# Patient Record
Sex: Female | Born: 1965 | Race: Black or African American | Hispanic: No | Smoking: Never smoker
Health system: Southern US, Community
[De-identification: ages and names within clinical notes are randomized; demographics above are authoritative.]

## PROBLEM LIST (undated history)

## (undated) DIAGNOSIS — K824 Cholesterolosis of gallbladder: Secondary | ICD-10-CM

## (undated) DIAGNOSIS — I10 Essential (primary) hypertension: Secondary | ICD-10-CM

## (undated) DIAGNOSIS — I639 Cerebral infarction, unspecified: Secondary | ICD-10-CM

## (undated) DIAGNOSIS — Z8673 Personal history of transient ischemic attack (TIA), and cerebral infarction without residual deficits: Secondary | ICD-10-CM

## (undated) DIAGNOSIS — D509 Iron deficiency anemia, unspecified: Secondary | ICD-10-CM

## (undated) DIAGNOSIS — D649 Anemia, unspecified: Secondary | ICD-10-CM

## (undated) DIAGNOSIS — E785 Hyperlipidemia, unspecified: Secondary | ICD-10-CM

## (undated) HISTORY — PX: TUBAL LIGATION: SHX77

---

## 2010-11-25 ENCOUNTER — Emergency Department (HOSPITAL_COMMUNITY)
Admission: EM | Admit: 2010-11-25 | Discharge: 2010-11-25 | Disposition: A | Discharge status: Home / Self Care | Payer: Managed Care, Other (non HMO) | Attending: Emergency Medicine | Admitting: Emergency Medicine

## 2010-11-25 DIAGNOSIS — Z79899 Other long term (current) drug therapy: Secondary | ICD-10-CM | POA: Insufficient documentation

## 2010-11-25 DIAGNOSIS — Z7982 Long term (current) use of aspirin: Secondary | ICD-10-CM | POA: Insufficient documentation

## 2010-11-25 DIAGNOSIS — H9209 Otalgia, unspecified ear: Secondary | ICD-10-CM | POA: Insufficient documentation

## 2010-11-25 DIAGNOSIS — I10 Essential (primary) hypertension: Secondary | ICD-10-CM | POA: Insufficient documentation

## 2010-11-25 DIAGNOSIS — Z8673 Personal history of transient ischemic attack (TIA), and cerebral infarction without residual deficits: Secondary | ICD-10-CM | POA: Insufficient documentation

## 2010-11-25 DIAGNOSIS — H9319 Tinnitus, unspecified ear: Secondary | ICD-10-CM | POA: Insufficient documentation

## 2011-01-29 ENCOUNTER — Ambulatory Visit
Admission: RE | Admit: 2011-01-29 | Discharge: 2011-01-29 | Disposition: A | Discharge status: Home / Self Care | Payer: Managed Care, Other (non HMO) | Source: Ambulatory Visit | Attending: Family Medicine | Admitting: Family Medicine

## 2011-01-29 ENCOUNTER — Other Ambulatory Visit: Payer: Self-pay | Admitting: Family Medicine

## 2011-01-29 DIAGNOSIS — I6789 Other cerebrovascular disease: Secondary | ICD-10-CM

## 2011-01-29 DIAGNOSIS — I10 Essential (primary) hypertension: Secondary | ICD-10-CM

## 2011-04-30 ENCOUNTER — Ambulatory Visit: Payer: Managed Care, Other (non HMO) | Admitting: Rehabilitative and Restorative Service Providers"

## 2011-05-05 ENCOUNTER — Ambulatory Visit: Payer: Managed Care, Other (non HMO) | Attending: Family Medicine | Admitting: Physical Therapy

## 2011-08-29 ENCOUNTER — Emergency Department: Payer: Self-pay | Admitting: Emergency Medicine

## 2011-09-16 ENCOUNTER — Encounter: Payer: Self-pay | Admitting: Emergency Medicine

## 2011-09-16 ENCOUNTER — Emergency Department (HOSPITAL_COMMUNITY)
Admission: EM | Admit: 2011-09-16 | Discharge: 2011-09-17 | Disposition: A | Discharge status: Home / Self Care | Payer: Medicaid Other | Attending: Emergency Medicine | Admitting: Emergency Medicine

## 2011-09-16 DIAGNOSIS — Y92009 Unspecified place in unspecified non-institutional (private) residence as the place of occurrence of the external cause: Secondary | ICD-10-CM | POA: Insufficient documentation

## 2011-09-16 DIAGNOSIS — W57XXXA Bitten or stung by nonvenomous insect and other nonvenomous arthropods, initial encounter: Secondary | ICD-10-CM | POA: Insufficient documentation

## 2011-09-16 DIAGNOSIS — T148 Other injury of unspecified body region: Secondary | ICD-10-CM | POA: Insufficient documentation

## 2011-09-16 DIAGNOSIS — Z8679 Personal history of other diseases of the circulatory system: Secondary | ICD-10-CM | POA: Insufficient documentation

## 2011-09-16 DIAGNOSIS — I10 Essential (primary) hypertension: Secondary | ICD-10-CM | POA: Insufficient documentation

## 2011-09-16 HISTORY — DX: Essential (primary) hypertension: I10

## 2011-09-16 HISTORY — DX: Cholesterolosis of gallbladder: K82.4

## 2011-09-16 HISTORY — DX: Cerebral infarction, unspecified: I63.9

## 2011-09-16 MED ORDER — FAMOTIDINE 20 MG PO TABS
20.0000 mg | ORAL_TABLET | Freq: Once | ORAL | Status: AC
  Administered 2011-09-17: 20 mg via ORAL
  Filled 2011-09-16: qty 1

## 2011-09-16 MED ORDER — FAMOTIDINE 20 MG PO TABS: 20.0000 mg | ORAL_TABLET | Freq: Two times a day (BID) | ORAL | Status: DC

## 2011-09-16 NOTE — ED Provider Notes (Signed)
History     CSN: [id number] Arrival date & time: 09/16/2011 10:04 PM   First MD Initiated Contact with Patient 09/16/11 2343      Chief Complaint  Patient presents with  . Insect Bite    bed bug bites    (Consider location/radiation/quality/duration/timing/severity/associated sxs/prior treatment) HPI Comments: Since moving into no pertinent has noticed multiple insect bites mostly occurring at night.  In the bed.  She states at first they started downstairs in the living room now has migrated to the entire home  The history is provided by the patient.    Past Medical History  Diagnosis Date  . Hypertension   . Stroke   . Cholesterosis     History reviewed. No pertinent past surgical history.  No family history on file.  History  Substance Use Topics  . Smoking status: Never Smoker   . Smokeless tobacco: Not on file  . Alcohol Use: No    OB History    Grav Para Term Preterm Abortions TAB SAB Ect Mult Living                  Review of Systems  Constitutional: Negative.   HENT: Negative.   Eyes: Negative.   Respiratory: Negative.   Cardiovascular: Negative.   Gastrointestinal: Negative.   Genitourinary: Negative.   Musculoskeletal: Negative.   Skin: Positive for rash.  Neurological: Negative.   Hematological: Negative.   Psychiatric/Behavioral: Negative.     Allergies  Review of patient's allergies indicates no known allergies.  Home Medications   Current Outpatient Rx  Name Route Sig Dispense Refill  . AMLODIPINE BESYLATE 5 MG PO TABS Oral Take 5 mg by mouth daily.      . ARIPIPRAZOLE 2 MG PO TABS Oral Take 2 mg by mouth daily.      Marland Kitchen RIVASTIGMINE 9.5 MG/24HR TD PT24 Transdermal Place 1 patch onto the skin daily.      Marland Kitchen SIMVASTATIN 40 MG PO TABS Oral Take 40 mg by mouth at bedtime.      . TRAZODONE HCL 50 MG PO TABS Oral Take 50 mg by mouth at bedtime as needed. For sleep.     Marland Kitchen FAMOTIDINE 20 MG PO TABS Oral Take 1 tablet (20 mg total) by mouth 2  (two) times daily. 30 tablet 0    BP 146/87  Pulse 101  Temp(Src) 97.9 F (36.6 C) (Oral)  Resp 19  SpO2 95%  Physical Exam  Constitutional: She is oriented to person, place, and time. She appears well-developed and well-nourished.  HENT:  Head: Normocephalic.  Eyes: Pupils are equal, round, and reactive to light.  Neck: Normal range of motion.  Cardiovascular: Normal rate.   Pulmonary/Chest: Effort normal.  Musculoskeletal: Normal range of motion.  Neurological: She is oriented to person, place, and time.  Skin:       Multiple insect bites over body     ED Course  Procedures (including critical care time)  Labs Reviewed - No data to display No results found.   1. Multiple insect bites       MDM  After history and examination, this most likely bed bug        Arman Filter, NP 09/16/11 2352  Arman Filter, NP 09/16/11 2354

## 2011-09-17 NOTE — ED Notes (Signed)
Pt seen treated and dispositioned by EDNP prior to RN assessment, see NP notes, orders received to medicate and d/c, initiated. Pt seen and d/c'd from triage.

## 2011-09-20 ENCOUNTER — Emergency Department (HOSPITAL_COMMUNITY)
Admission: EM | Admit: 2011-09-20 | Discharge: 2011-09-20 | Disposition: A | Discharge status: Home / Self Care | Payer: Managed Care, Other (non HMO) | Attending: Emergency Medicine | Admitting: Emergency Medicine

## 2011-09-20 ENCOUNTER — Encounter (HOSPITAL_COMMUNITY): Payer: Self-pay | Admitting: Emergency Medicine

## 2011-09-20 DIAGNOSIS — L298 Other pruritus: Secondary | ICD-10-CM | POA: Insufficient documentation

## 2011-09-20 DIAGNOSIS — R21 Rash and other nonspecific skin eruption: Secondary | ICD-10-CM | POA: Insufficient documentation

## 2011-09-20 DIAGNOSIS — Z79899 Other long term (current) drug therapy: Secondary | ICD-10-CM | POA: Insufficient documentation

## 2011-09-20 DIAGNOSIS — Z8673 Personal history of transient ischemic attack (TIA), and cerebral infarction without residual deficits: Secondary | ICD-10-CM | POA: Insufficient documentation

## 2011-09-20 DIAGNOSIS — I10 Essential (primary) hypertension: Secondary | ICD-10-CM | POA: Insufficient documentation

## 2011-09-20 DIAGNOSIS — S90569A Insect bite (nonvenomous), unspecified ankle, initial encounter: Secondary | ICD-10-CM | POA: Insufficient documentation

## 2011-09-20 DIAGNOSIS — L2989 Other pruritus: Secondary | ICD-10-CM | POA: Insufficient documentation

## 2011-09-20 DIAGNOSIS — B86 Scabies: Secondary | ICD-10-CM | POA: Insufficient documentation

## 2011-09-20 MED ORDER — PERMETHRIN 5 % EX CREA: TOPICAL_CREAM | CUTANEOUS | Status: AC

## 2011-09-20 NOTE — ED Notes (Signed)
Pt tearful upon assessment.  EDP in room to assess pt at this time.

## 2011-09-20 NOTE — ED Notes (Signed)
PT. REPORTS GENERALIZED ITCHY RASH FOR 3 DAYS , PRECRIBED WITH PEPCID WITH NO RELIEF.

## 2011-09-20 NOTE — ED Provider Notes (Signed)
History     CSN: [id number] Arrival date & time: 09/20/2011  8:13 PM   First MD Initiated Contact with Patient 09/20/11 2046      Chief Complaint  Patient presents with  . Rash    (Consider location/radiation/quality/duration/timing/severity/associated sxs/prior treatment) Patient is a 45 y.o. female presenting with rash. The history is provided by the patient. No language interpreter was used.  Rash  This is a new problem. The problem is associated with an insect bite/sting. There has been no fever. The fever has been present for 1 to 2 days. The rash is present on the left lower leg and right lower leg. The pain is at a severity of 4/10. The pain is moderate. The pain has been constant since onset. Associated symptoms include itching. The treatment provided no relief. Risk factors include new environmental exposures.   Reorts a rash bilateral upper left upper extremities and feet x3 days. Was prescribed a pepcid with no relief yesterday.   Has had contacts with scabies in apartment complex. Her 2 daughters are also here with the same. Past Medical History  Diagnosis Date  . Hypertension   . Stroke   . Cholesterosis     History reviewed. No pertinent past surgical history.  No family history on file.  History  Substance Use Topics  . Smoking status: Never Smoker   . Smokeless tobacco: Not on file  . Alcohol Use: No    OB History    Grav Para Term Preterm Abortions TAB SAB Ect Mult Living                  Review of Systems  Skin: Positive for itching and rash.  All other systems reviewed and are negative.    Allergies  Review of patient's allergies indicates no known allergies.  Home Medications   Current Outpatient Rx  Name Route Sig Dispense Refill  . AMLODIPINE BESYLATE 5 MG PO TABS Oral Take 5 mg by mouth daily.      . ARIPIPRAZOLE 2 MG PO TABS Oral Take 2 mg by mouth daily.      Marland Kitchen FAMOTIDINE 20 MG PO TABS Oral Take 1 tablet (20 mg total) by mouth 2  (two) times daily. 30 tablet 0  . RIVASTIGMINE 9.5 MG/24HR TD PT24 Transdermal Place 1 patch onto the skin daily.      Marland Kitchen SIMVASTATIN 40 MG PO TABS Oral Take 40 mg by mouth at bedtime.      . TRAZODONE HCL 50 MG PO TABS Oral Take 50 mg by mouth at bedtime as needed. For sleep.       BP 153/86  Pulse 94  Temp(Src) 97.9 F (36.6 C) (Oral)  Resp 12  SpO2 99%  Physical Exam  Nursing note and vitals reviewed. Constitutional: She is oriented to person, place, and time. She appears well-developed and well-nourished.  HENT:  Head: Normocephalic.  Eyes: Pupils are equal, round, and reactive to light.  Neck: Normal range of motion.  Pulmonary/Chest: Effort normal.  Musculoskeletal: Normal range of motion.  Neurological: She is alert and oriented to person, place, and time.  Skin: Rash noted.    ED Course  Procedures (including critical care time)  Labs Reviewed - No data to display No results found.   No diagnosis found.    MDM  Treatment for scabies for her and her 2 daughters after exposed to contacts at her apartment complex.        Jethro Bastos, NP 09/21/11 1929

## 2011-09-21 NOTE — ED Provider Notes (Signed)
Medical screening examination/treatment/procedure(s) were performed by non-physician practitioner and as supervising physician I was immediately available for consultation/collaboration.  Rawleigh Rode, MD 09/21/11 2049 

## 2011-10-01 NOTE — ED Provider Notes (Signed)
Medical screening examination/treatment/procedure(s) were performed by non-physician practitioner and as supervising physician I was immediately available for consultation/collaboration.   Suzi Roots, MD 10/01/11 0730

## 2012-05-07 ENCOUNTER — Encounter: Payer: Managed Care, Other (non HMO) | Admitting: Advanced Practice Midwife

## 2012-06-14 ENCOUNTER — Other Ambulatory Visit: Payer: Self-pay | Admitting: Family Medicine

## 2012-06-14 DIAGNOSIS — Z1231 Encounter for screening mammogram for malignant neoplasm of breast: Secondary | ICD-10-CM

## 2012-09-06 ENCOUNTER — Encounter (HOSPITAL_COMMUNITY): Payer: Self-pay | Admitting: *Deleted

## 2012-09-06 ENCOUNTER — Emergency Department (HOSPITAL_COMMUNITY): Payer: Medicare Other

## 2012-09-06 ENCOUNTER — Emergency Department (HOSPITAL_COMMUNITY)
Admission: EM | Admit: 2012-09-06 | Discharge: 2012-09-07 | Disposition: A | Discharge status: Home / Self Care | Payer: Medicare Other | Attending: Emergency Medicine | Admitting: Emergency Medicine

## 2012-09-06 DIAGNOSIS — R42 Dizziness and giddiness: Secondary | ICD-10-CM | POA: Insufficient documentation

## 2012-09-06 DIAGNOSIS — I10 Essential (primary) hypertension: Secondary | ICD-10-CM | POA: Insufficient documentation

## 2012-09-06 DIAGNOSIS — N920 Excessive and frequent menstruation with regular cycle: Secondary | ICD-10-CM | POA: Insufficient documentation

## 2012-09-06 DIAGNOSIS — D219 Benign neoplasm of connective and other soft tissue, unspecified: Secondary | ICD-10-CM

## 2012-09-06 DIAGNOSIS — R5381 Other malaise: Secondary | ICD-10-CM | POA: Insufficient documentation

## 2012-09-06 DIAGNOSIS — R5383 Other fatigue: Secondary | ICD-10-CM | POA: Insufficient documentation

## 2012-09-06 DIAGNOSIS — D259 Leiomyoma of uterus, unspecified: Secondary | ICD-10-CM | POA: Insufficient documentation

## 2012-09-06 DIAGNOSIS — Z8673 Personal history of transient ischemic attack (TIA), and cerebral infarction without residual deficits: Secondary | ICD-10-CM | POA: Insufficient documentation

## 2012-09-06 DIAGNOSIS — D649 Anemia, unspecified: Secondary | ICD-10-CM

## 2012-09-06 DIAGNOSIS — Z79899 Other long term (current) drug therapy: Secondary | ICD-10-CM | POA: Insufficient documentation

## 2012-09-06 LAB — CBC WITH DIFFERENTIAL/PLATELET
Basophils Absolute: 0 10*3/uL (ref 0.0–0.1)
Basophils Relative: 0 % (ref 0–1)
Eosinophils Absolute: 0.2 10*3/uL (ref 0.0–0.7)
HCT: 28.1 % — ABNORMAL LOW (ref 36.0–46.0)
Hemoglobin: 8.7 g/dL — ABNORMAL LOW (ref 12.0–15.0)
MCH: 23.4 pg — ABNORMAL LOW (ref 26.0–34.0)
MCHC: 31 g/dL (ref 30.0–36.0)
Monocytes Absolute: 0.4 10*3/uL (ref 0.1–1.0)
Monocytes Relative: 6 % (ref 3–12)
Neutro Abs: 4.1 10*3/uL (ref 1.7–7.7)
RDW: 18.3 % — ABNORMAL HIGH (ref 11.5–15.5)

## 2012-09-06 LAB — COMPREHENSIVE METABOLIC PANEL
AST: 16 U/L (ref 0–37)
Albumin: 3.4 g/dL — ABNORMAL LOW (ref 3.5–5.2)
BUN: 6 mg/dL (ref 6–23)
Calcium: 9.3 mg/dL (ref 8.4–10.5)
Creatinine, Ser: 0.55 mg/dL (ref 0.50–1.10)
Total Protein: 7.3 g/dL (ref 6.0–8.3)

## 2012-09-06 MED ORDER — SODIUM CHLORIDE 0.9 % IV BOLUS (SEPSIS)
1000.0000 mL | Freq: Once | INTRAVENOUS | Status: AC
  Administered 2012-09-06: 1000 mL via INTRAVENOUS

## 2012-09-06 NOTE — ED Notes (Signed)
Pt in c/o abnormal vaginal bleeding x2 days, states she has not had a normal cycle in months but yesterday started bleeding, c/o severe cramps and very heavy bleeding, pt noted large blood clots.

## 2012-09-06 NOTE — ED Notes (Signed)
Pt refused to stand for orthostatic v/s. Stated she know she will pass out. Pt lying back in bed, rails up and call bell in reach.

## 2012-09-06 NOTE — ED Provider Notes (Signed)
History     CSN: [id number]  Arrival date & time 09/06/12  [place]   First MD Initiated Contact with Patient 09/06/12 2131      Chief Complaint  Patient presents with  . Abdominal Pain  . Vaginal Bleeding    (Consider location/radiation/quality/duration/timing/severity/associated sxs/prior treatment) HPI Cynthia [name] is a 46 y.o. female who presents with complaint of heavy vaginal bleeding. States she has had irregular menstrual cycles for last several months. States heavier bleeding then normal. State this time, started bleeding 3 days ago. States bleeding is heavier than she has ever had. Going through 1-2 pads every hour. States feeling dizzy at times.  Intermittent lower abdominal cramping. States does not have OB/GYN. Nothing makes her symptoms better or worse. Did not take any medications for this.    Past Medical History  Diagnosis Date  . Hypertension   . Stroke   . Cholesterosis     History reviewed. No pertinent past surgical history.  History reviewed. No pertinent family history.  History  Substance Use Topics  . Smoking status: Never Smoker   . Smokeless tobacco: Not on file  . Alcohol Use: No    OB History    Grav Para Term Preterm Abortions TAB SAB Ect Mult Living                  Review of Systems  Constitutional: Negative for fever and chills.  HENT: Negative for neck pain and neck stiffness.   Eyes: Negative for visual disturbance.  Respiratory: Negative.   Cardiovascular: Negative.   Gastrointestinal: Positive for abdominal pain. Negative for nausea, vomiting and blood in stool.  Genitourinary: Positive for vaginal bleeding and pelvic pain. Negative for dysuria, flank pain, vaginal discharge and vaginal pain.  Musculoskeletal: Negative.   Neurological: Positive for dizziness, weakness and light-headedness.  Hematological: Negative.     Allergies  Review of patient's allergies indicates no known allergies.  Home Medications   Current  Outpatient Rx  Name  Route  Sig  Dispense  Refill  . AMLODIPINE BESYLATE 5 MG PO TABS   Oral   Take 5 mg by mouth daily.           . ARIPIPRAZOLE 2 MG PO TABS   Oral   Take 2 mg by mouth daily.           Marland Kitchen RIVASTIGMINE 9.5 MG/24HR TD PT24   Transdermal   Place 1 patch onto the skin daily.           Marland Kitchen SIMVASTATIN 40 MG PO TABS   Oral   Take 40 mg by mouth at bedtime.           . TRAZODONE HCL 50 MG PO TABS   Oral   Take 50 mg by mouth at bedtime as needed. For sleep.            BP 148/90  Pulse 107  Temp 98.4 F (36.9 C) (Oral)  Resp 20  SpO2 100%  Physical Exam  Nursing note and vitals reviewed. Constitutional: She is oriented to person, place, and time. She appears well-developed and well-nourished. No distress.  HENT:  Head: Normocephalic.  Eyes: Conjunctivae normal are normal.  Neck: Neck supple.  Cardiovascular: Normal rate, regular rhythm and normal heart sounds.   Pulmonary/Chest: Effort normal and breath sounds normal. No respiratory distress. She has no wheezes. She has no rales.  Abdominal: Soft. Bowel sounds are normal. She exhibits no distension. There is tenderness. There is no rebound  and no guarding.       Suprapubic tenerness  Musculoskeletal: She exhibits no edema.  Neurological: She is alert and oriented to person, place, and time.  Skin: Skin is warm and dry.  Psychiatric: She has a normal mood and affect.    ED Course  Procedures (including critical care time)  Pt with irregular and heavy menustrual cycle. Will get UA, labs, urine pregnancy  Results for orders placed during the hospital encounter of 09/06/12  CBC WITH DIFFERENTIAL      Component Value Range   WBC 6.8  4.0 - 10.5 K/uL   RBC 3.72 (*) 3.87 - 5.11 MIL/uL   Hemoglobin 8.7 (*) 12.0 - 15.0 g/dL   HCT 16.1 (*) 09.6 - 04.5 %   MCV 75.5 (*) 78.0 - 100.0 fL   MCH 23.4 (*) 26.0 - 34.0 pg   MCHC 31.0  30.0 - 36.0 g/dL   RDW 40.9 (*) 81.1 - 91.4 %   Platelets 355  150 -  400 K/uL   Neutrophils Relative 60  43 - 77 %   Neutro Abs 4.1  1.7 - 7.7 K/uL   Lymphocytes Relative 31  12 - 46 %   Lymphs Abs 2.1  0.7 - 4.0 K/uL   Monocytes Relative 6  3 - 12 %   Monocytes Absolute 0.4  0.1 - 1.0 K/uL   Eosinophils Relative 2  0 - 5 %   Eosinophils Absolute 0.2  0.0 - 0.7 K/uL   Basophils Relative 0  0 - 1 %   Basophils Absolute 0.0  0.0 - 0.1 K/uL  COMPREHENSIVE METABOLIC PANEL      Component Value Range   Sodium 139  135 - 145 mEq/L   Potassium 3.6  3.5 - 5.1 mEq/L   Chloride 105  96 - 112 mEq/L   CO2 23  19 - 32 mEq/L   Glucose, Bld 96  70 - 99 mg/dL   BUN 6  6 - 23 mg/dL   Creatinine, Ser 7.82  0.50 - 1.10 mg/dL   Calcium 9.3  8.4 - 95.6 mg/dL   Total Protein 7.3  6.0 - 8.3 g/dL   Albumin 3.4 (*) 3.5 - 5.2 g/dL   AST 16  0 - 37 U/L   ALT 15  0 - 35 U/L   Alkaline Phosphatase 77  39 - 117 U/L   Total Bilirubin 0.2 (*) 0.3 - 1.2 mg/dL   GFR calc non Af Amer >90  >90 mL/min   GFR calc Af Amer >90  >90 mL/min   US Transvaginal Non-ob  09/06/2012  *RADIOLOGY REPORT*  Clinical Data: Pelvic pain.  Vaginal bleeding.  LMP 09/03/2012.  TRANSABDOMINAL AND TRANSVAGINAL ULTRASOUND OF PELVIS  Technique:  Both transabdominal and transvaginal ultrasound examinations of the pelvis were performed.  Transabdominal technique was performed for global imaging of the pelvis including uterus, ovaries, adnexal regions, and pelvic cul-de-sac.  It was necessary to proceed with endovaginal exam following the transabdominal exam to visualize the endometrium and ovaries.  Comparison:  None.  Findings: Uterus:  11.0 x 5.0 x 7.4 cm.  Heterogeneous echogenicity of uterine myometrium noted.  A fibroid is seen in the uterine fundus measuring 2.0 cm in maximum diameter, whichis partially located in the endometrial cavity and could be submucosal or intracavitary in location.  A subserosal fibroid is also seen in the left anterior corpus measuring 2.5 cm.  Endometrium: Double layer thickness  measures 12 mm transvaginally. A 2 cm submucosal or  intracavitary fibroid again noted in the fundal region.  Right ovary: not directly visualized by transabdominal or transvaginal sonography, however no adnexal mass identified.  Left ovary: not directly visualized by transabdominal or transvaginal sonography, however no adnexal mass identified.  Other Findings:  No free fluid  IMPRESSION:  1.  Heterogeneous myometrium, with at least two distinct fibroids measuring up to 2.5 cm.   A 2 cm fibroid in the fundus may be submucosal or intracavitary in location.  Sonohysterogram should be considered for further evaluation prior to hysteroscopy. 2.  Nonvisualization of the ovaries, however no adnexal mass identified.   Original Report Authenticated By: Myles Rosenthal, M.D.    US Pelvis Complete  09/06/2012  *RADIOLOGY REPORT*  Clinical Data: Pelvic pain.  Vaginal bleeding.  LMP 09/03/2012.  TRANSABDOMINAL AND TRANSVAGINAL ULTRASOUND OF PELVIS  Technique:  Both transabdominal and transvaginal ultrasound examinations of the pelvis were performed.  Transabdominal technique was performed for global imaging of the pelvis including uterus, ovaries, adnexal regions, and pelvic cul-de-sac.  It was necessary to proceed with endovaginal exam following the transabdominal exam to visualize the endometrium and ovaries.  Comparison:  None.  Findings: Uterus:  11.0 x 5.0 x 7.4 cm.  Heterogeneous echogenicity of uterine myometrium noted.  A fibroid is seen in the uterine fundus measuring 2.0 cm in maximum diameter, whichis partially located in the endometrial cavity and could be submucosal or intracavitary in location.  A subserosal fibroid is also seen in the left anterior corpus measuring 2.5 cm.  Endometrium: Double layer thickness measures 12 mm transvaginally. A 2 cm submucosal or intracavitary fibroid again noted in the fundal region.  Right ovary: not directly visualized by transabdominal or transvaginal sonography, however no  adnexal mass identified.  Left ovary: not directly visualized by transabdominal or transvaginal sonography, however no adnexal mass identified.  Other Findings:  No free fluid  IMPRESSION:  1.  Heterogeneous myometrium, with at least two distinct fibroids measuring up to 2.5 cm.   A 2 cm fibroid in the fundus may be submucosal or intracavitary in location.  Sonohysterogram should be considered for further evaluation prior to hysteroscopy. 2.  Nonvisualization of the ovaries, however no adnexal mass identified.   Original Report Authenticated By: Myles Rosenthal, M.D.      10:11 PM Went in to do pt's pelvic exam. Pt refused. Stated she would rather not have it done. Explained why we perform exam, pt continued to refuse. Also refused in and out urine cath. Will get [place] to look for abnormalities.   1. Menorrhagia   2. Fibroids   3. Anemia       MDM  Pt with heavy vaginal bleeding, onset 2-3 days ago. Labs show anemia, hgb of 8.7. Pt does feel dizzy, but her vitals are not orthostatic. Pt has hx of stroke and walks with cane at baseline, gets help at home from her daughter. She refused pelvic exam here. Her abdominal exam is benign. Her [place] of pelvis showed two fibroids. Spoke with GYN, Dr. Debroah Loop, who recommended follow up with them in the office, iron supplements, NSAIDs. Pt is stable for D/C home at this time, with close follow up.   Filed Vitals:   09/06/12 2353 09/07/12 0000 09/07/12 0030 09/07/12 0037  BP: 141/75 126/66 111/58 111/58  Pulse: 92 93 90 88  Temp:      TempSrc:      Resp:  22 18 17   SpO2:  98% 98% 100%  Lottie Mussel, [place] 09/07/12 [phone]

## 2012-09-07 LAB — URINALYSIS, ROUTINE W REFLEX MICROSCOPIC
Bilirubin Urine: NEGATIVE
Glucose, UA: NEGATIVE mg/dL
Ketones, ur: 15 mg/dL — AB
Protein, ur: 30 mg/dL — AB
pH: 6 (ref 5.0–8.0)

## 2012-09-07 LAB — URINE MICROSCOPIC-ADD ON

## 2012-09-07 LAB — PREGNANCY, URINE: Preg Test, Ur: NEGATIVE

## 2012-09-07 MED ORDER — KETOROLAC TROMETHAMINE 30 MG/ML IJ SOLN
30.0000 mg | Freq: Once | INTRAMUSCULAR | Status: AC
  Administered 2012-09-07: 30 mg via INTRAVENOUS
  Filled 2012-09-07: qty 1

## 2012-09-07 MED ORDER — NAPROXEN 500 MG PO TABS: 500.0000 mg | ORAL_TABLET | Freq: Two times a day (BID) | ORAL | Status: DC

## 2012-09-07 MED ORDER — FERROUS SULFATE 325 (65 FE) MG PO TABS: 325.0000 mg | ORAL_TABLET | Freq: Every day | ORAL | Status: DC

## 2012-09-09 NOTE — ED Provider Notes (Signed)
Medical screening examination/treatment/procedure(s) were performed by non-physician practitioner and as supervising physician I was immediately available for consultation/collaboration.  Doug Sou, MD 09/09/12 [phone]

## 2012-09-27 ENCOUNTER — Ambulatory Visit (INDEPENDENT_AMBULATORY_CARE_PROVIDER_SITE_OTHER): Payer: Managed Care, Other (non HMO) | Admitting: Obstetrics & Gynecology

## 2012-09-27 ENCOUNTER — Encounter: Payer: Self-pay | Admitting: Obstetrics & Gynecology

## 2012-09-27 VITALS — BP 127/85 | HR 95 | Temp 98.1°F | Ht 68.5 in | Wt 260.8 lb

## 2012-09-27 DIAGNOSIS — I69993 Ataxia following unspecified cerebrovascular disease: Secondary | ICD-10-CM | POA: Insufficient documentation

## 2012-09-27 DIAGNOSIS — D649 Anemia, unspecified: Secondary | ICD-10-CM

## 2012-09-27 DIAGNOSIS — D219 Benign neoplasm of connective and other soft tissue, unspecified: Secondary | ICD-10-CM | POA: Insufficient documentation

## 2012-09-27 DIAGNOSIS — N951 Menopausal and female climacteric states: Secondary | ICD-10-CM

## 2012-09-27 DIAGNOSIS — D259 Leiomyoma of uterus, unspecified: Secondary | ICD-10-CM

## 2012-09-27 DIAGNOSIS — N920 Excessive and frequent menstruation with regular cycle: Secondary | ICD-10-CM

## 2012-09-27 MED ORDER — MEGESTROL ACETATE 20 MG PO TABS: 20.0000 mg | ORAL_TABLET | Freq: Every day | ORAL | Status: DC

## 2012-09-27 MED ORDER — INTEGRA F 125-1 MG PO CAPS: 1.0000 | ORAL_CAPSULE | Freq: Every day | ORAL | Status: DC

## 2012-09-27 NOTE — Patient Instructions (Addendum)
Uterine Fibroid A uterine fibroid is a growth (tumor) that occurs in a woman's uterus. This type of tumor is not cancerous and does not spread out of the uterus. A woman can have one or many fibroids, and the fiboid(s) can become quite large. A fibroid can vary in size, weight, and where it grows in the uterus. Most fibroids do not require medical treatment, but some can cause pain or heavy bleeding during and between periods. CAUSES  A fibroid is the result of a single uterine cell that keeps growing (unregulated), which is different than most cells in the human body. Most cells have a control mechanism that keeps them from reproducing without control.  SYMPTOMS   Bleeding.  Pelvic pain and pressure.  Bladder problems due to the size of the fibroid.  Infertility and miscarriages depending on the size and location of the fibroid. DIAGNOSIS  A diagnosis is made by physical exam. Your caregiver may feel the lumpy tumors during a pelvic exam. Important information regarding size, location, and number of tumors can be gained by having an ultrasound. It is rare that other tests, such as a CT scan or MRI, are needed. TREATMENT   Your caregiver may recommend watchful waiting. This involves getting the fibroid checked by your caregiver to see if the fibroids grow or shrink.   Hormonal treatment or an intrauterine device (IUD) may be prescribed.   Surgery may be needed to remove the fibroids (myomectomy) or the uterus (hysterectomy). This depends on your situation. When fibroids interfere with fertility and a woman wants to become pregnant, a caregiver may recommend having the fibroids removed.  HOME CARE INSTRUCTIONS  Home care depends on how you were treated. In general:   Keep all follow-up appointments with your caregiver.   Only take medicine as told by your caregiver. Do not take aspirin. It can cause bleeding.   If you have excessive periods and soak tampons or pads in a half hour or  less, contact your caregiver immediately. If your periods are troublesome but not so heavy, lie down with your feet raised slightly above your heart. Place cold packs on your lower abdomen.   If your periods are heavy, write down the number of pads or tampons you use per month. Bring this information to your caregiver.   Talk to your caregiver about taking iron pills.   Include green vegetables in your diet.   If you were prescribed a hormonal treatment, take the hormonal medicines as directed.   If you need surgery, ask your caregiver for information on your specific surgery.  SEEK IMMEDIATE MEDICAL CARE IF:  You have pelvic pain or cramps not controlled with medicines.   You have a sudden increase in pelvic pain.   You have an increase of bleeding between and during periods.   You feel lightheaded or have fainting episodes.  MAKE SURE YOU:  Understand these instructions.  Will watch your condition.  Will get help right away if you are not doing well or get worse. Document Released: 09/19/2000 Document Revised: 12/15/2011 Document Reviewed: 10/13/2011 [facility] Patient Information 2013 [place], [place]. Anemia, Frequently Asked Questions WHAT ARE THE SYMPTOMS OF ANEMIA?  Headache.  Difficulty thinking.  Fatigue.  Shortness of breath.  Weakness.  Rapid heartbeat. AT WHAT POINT ARE PEOPLE CONSIDERED ANEMIC?  This varies with gender and age.   Both hemoglobin (Hgb) and hematocrit values are used to define anemia. These lab values are obtained from a complete blood count (CBC) test. This  is performed at a caregiver's office.  The normal range of hemoglobin values for adult men is 14.0 g/dL to 78.2 g/dL. For nonpregnant women, values are 12.3 g/dL to 95.6 g/dL.  The World Health Organization defines anemia as less than 12 g/dL for nonpregnant women and less than 13 g/dL for men.  For adult males, the average normal hematocrit is 46%, and the range is 40% to  52%.  For adult females, the average normal hematocrit is 41%, and the range is 35% to 47%.  Values that fall below the lower limits can be a sign of anemia and should have further checking (evaluation). GROUPS OF PEOPLE WHO ARE AT RISK FOR DEVELOPING ANEMIA INCLUDE:   Infants who are breastfed or taking a formula that is not fortified with iron.  Children going through a rapid growth spurt. The iron available can not keep up with the needs for a red cell mass which must grow with the child.  Women in childbearing years. They need iron because of blood loss during menstruation.  Pregnant women. The growing fetus creates a high demand for iron.  People with ongoing gastrointestinal blood loss are at risk of developing iron deficiency.  Individuals with leukemia or cancer who must receive chemotherapy or radiation to treat their disease. The drugs or radiation used to treat these diseases often decreases the bone marrow's ability to make cells of all classes. This includes red blood cells, white blood cells, and platelets.  Individuals with chronic inflammatory conditions such as rheumatoid arthritis or chronic infections.  The elderly. ARE SOME TYPES OF ANEMIA INHERITED?   Yes, some types of anemia are due to inherited or genetic defects.  Sickle cell anemia. This occurs most often in people of African, African American, and Mediterranean descent.  Thalassemia (or Cooley's anemia). This type is found in people of Mediterranean and Southeast Asian descent. These types of anemia are common.  [place]. This is rare. CAN CERTAIN MEDICATIONS CAUSE A PERSON TO BECOME ANEMIC?  Yes. For example, drugs to fight cancer (chemotherapeutic agents) often cause anemia. These drugs can slow the bone marrow's ability to make red blood cells. If there are not enough red blood cells, the body does not get enough oxygen. WHAT HEMATOCRIT LEVEL IS REQUIRED TO DONATE BLOOD?  The lower limit of an acceptable  hematocrit for blood donors is 38%. If you have a low hematocrit value, you should schedule an appointment with your caregiver. ARE BLOOD TRANSFUSIONS COMMONLY USED TO CORRECT ANEMIA, AND ARE THEY DANGEROUS?  They are used to treat anemia as a last resort. Your caregiver will find the cause of the anemia and correct it if possible. Most blood transfusions are given because of excessive bleeding at the time of surgery, with trauma, or because of bone marrow suppression in patients with cancer or leukemia on chemotherapy. Blood transfusions are safer than ever before. We also know that blood transfusions affect the immune system and may increase certain risks. There is also a concern for human error. In 1/16,000 transfusions, a patient receives a transfusion of blood that is not matched with his or her blood type.  WHAT IS IRON DEFICIENCY ANEMIA AND CAN I CORRECT IT BY CHANGING MY DIET?  Iron is an essential part of hemoglobin. Without enough hemoglobin, anemia develops and the body does not get the right amount of oxygen. Iron deficiency anemia develops after the body has had a low level of iron for a long time. This is either caused by blood  loss, not taking in or absorbing enough iron, or increased demands for iron (like pregnancy or rapid growth).  Foods from animal origin such as beef, chicken, and pork, are good sources of iron. Be sure to have one of these foods at each meal. Vitamin C helps your body absorb iron. Foods rich in Vitamin C include citrus, bell pepper, strawberries, spinach and cantaloupe. In some cases, iron supplements may be needed in order to correct the iron deficiency. In the case of poor absorption, extra iron may have to be given directly into the vein through a needle (intravenously). I HAVE BEEN DIAGNOSED WITH IRON DEFICIENCY ANEMIA AND MY CAREGIVER PRESCRIBED IRON SUPPLEMENTS. HOW LONG WILL IT TAKE FOR MY BLOOD TO BECOME NORMAL?  It depends on the degree of anemia at the  beginning of treatment. Most people with mild to moderate iron deficiency, anemia will correct the anemia over a period of 2 to 3 months. But after the anemia is corrected, the iron stored by the body is still low. Caregivers often suggest an additional 6 months of oral iron therapy once the anemia has been reversed. This will help prevent the iron deficiency anemia from quickly happening again. Non-anemic adult males should take iron supplements only under the direction of a doctor, too much iron can cause liver damage.  MY HEMOGLOBIN IS 9 G/DL AND I AM SCHEDULED FOR SURGERY. SHOULD I POSTPONE THE SURGERY?  If you have Hgb of 9, you should discuss this with your caregiver right away. Many patients with similar hemoglobin levels have had surgery without problems. If minimal blood loss is expected for a minor procedure, no treatment may be necessary.  If a greater blood loss is expected for more extensive procedures, you should ask your caregiver about being treated with erythropoietin and iron. This is to accelerate the recovery of your hemoglobin to a normal level before surgery. An anemic patient who undergoes high-blood-loss surgery has a greater risk of surgical complications and need for a blood transfusion, which also carries some risk.  I HAVE BEEN TOLD THAT HEAVY MENSTRUAL PERIODS CAUSE ANEMIA. IS THERE ANYTHING I CAN DO TO PREVENT THE ANEMIA?  Anemia that results from heavy periods is usually due to iron deficiency. You can try to meet the increased demands for iron caused by the heavy monthly blood loss by increasing the intake of iron-rich foods. Iron supplements may be required. Discuss your concerns with your caregiver. WHAT CAUSES ANEMIA DURING PREGNANCY?  Pregnancy places major demands on the body. The mother must meet the needs of both her body and her growing baby. The body needs enough iron and folate to make the right amount of red blood cells. To prevent anemia while pregnant, the mother  should stay in close contact with her caregiver.  Be sure to eat a diet that has foods rich in iron and folate like liver and dark green leafy vegetables. Folate plays an important role in the normal development of a baby's spinal cord. Folate can help prevent serious disorders like spina bifida. If your diet does not provide adequate nutrients, you may want to talk with your caregiver about nutritional supplements.  WHAT IS THE RELATIONSHIP BETWEEN FIBROID TUMORS AND ANEMIA IN WOMEN?  The relationship is usually caused by the increased menstrual blood loss caused by fibroids. Good iron intake may be required to prevent iron deficiency anemia from developing.  Document Released: 04/30/2004 Document Revised: 12/15/2011 Document Reviewed: 10/15/2010 [facility] Patient Information 2013 [place], [place]. Menopause Menopause is  the normal time of life when menstrual periods stop completely. Menopause is complete when you have missed 12 consecutive menstrual periods. It usually occurs between the ages of 47 to 11, with an average age of 55. Very rarely does a woman develop menopause before 45 years old. At menopause, your ovaries stop producing the female hormones, estrogen and progesterone. This can cause undesirable symptoms and also affect your health. Sometimes the symptoms may occur 4 to 5 years before the menopause begins. There is no relationship between menopause and:  Oral contraceptives.  Number of children you had.  Race.  The age your menstrual periods started (menarche). Heavy smokers and very thin women may develop menopause earlier in life. CAUSES  The ovaries stop producing the female hormones estrogen and progesterone.  Other causes include:  Surgery to remove both ovaries.  The ovaries stop functioning for no known reason.  Tumors of the pituitary gland in the brain.  Medical disease that affects the ovaries and hormone production.  Radiation treatment to the abdomen or  pelvis.  Chemotherapy that affects the ovaries. SYMPTOMS   Hot flashes.  Night sweats.  Decrease in sex drive.  Vaginal dryness and thinning of the vagina causing painful intercourse.  Dryness of the skin and developing wrinkles.  Headaches.  Tiredness.  Irritability.  Memory problems.  Weight gain.  Bladder infections.  Hair growth of the face and chest.  Infertility. More serious symptoms include:  Loss of bone (osteoporosis) causing breaks (fractures).  Depression.  Hardening and narrowing of the arteries (atherosclerosis) causing heart attacks and strokes. DIAGNOSIS   When the menstrual periods have stopped for 12 straight months.  Physical exam.  Hormone studies of the blood. TREATMENT  There are many treatment choices and nearly as many questions about them. The decisions to treat or not to treat menopausal changes is an individual choice made with your caregiver. Your caregiver can discuss the treatments with you. Together, you can decide which treatment will work best for you. Your treatment choices may include:   Hormone therapy (estorgen and progesterone).  Non-hormonal medications.  Treating the individual symptoms with medication (for example antidepressants for depression).  Herbal medications that may help specific symptoms.  Counseling by a psychiatrist or psychologist.  Group therapy.  Lifestyle changes including:  Eating healthy.  Regular exercise.  Limiting caffeine and alcohol.  Stress management and meditation.  No treatment. HOME CARE INSTRUCTIONS   Take the medication your caregiver gives you as directed.  Get plenty of sleep and rest.  Exercise regularly.  Eat a diet that contains calcium (good for the bones) and soy products (acts like estrogen hormone).  Avoid alcoholic beverages.  Do not smoke.  If you have hot flashes, dress in layers.  Take supplements, calcium and vitamin D to strengthen bones.  You  can use over-the-counter lubricants or moisturizers for vaginal dryness.  Group therapy is sometimes very helpful.  Acupuncture may be helpful in some cases. SEEK MEDICAL CARE IF:   You are not sure you are in menopause.  You are having menopausal symptoms and need advice and treatment.  You are still having menstrual periods after age 49.  You have pain with intercourse.  Menopause is complete (no menstrual period for 12 months) and you develop vaginal bleeding.  You need a referral to a specialist (gynecologist, psychiatrist or psychologist) for treatment. SEEK IMMEDIATE MEDICAL CARE IF:   You have severe depression.  You have excessive vaginal bleeding.  You fell and think you  have a broken bone.  You have pain when you urinate.  You develop leg or chest pain.  You have a fast pounding heart beat (palpitations).  You have severe headaches.  You develop vision problems.  You feel a lump in your breast.  You have abdominal pain or severe indigestion. Document Released: 12/13/2003 Document Revised: 12/15/2011 Document Reviewed: 07/20/2008 [facility] Patient Information 2013 [place], [place]. Dysfunctional Uterine Bleeding Normally, menstrual periods begin between ages 8 to 58 in young women. A normal menstrual cycle/period may begin every 23 days up to 35 days and lasts from 1 to 7 days. Around 12 to 14 days before your menstrual period starts, ovulation (ovary produces an egg) occurs. When counting the time between menstrual periods, count from the first day of bleeding of the previous period to the first day of bleeding of the next period. Dysfunctional (abnormal) uterine bleeding is bleeding that is different from a normal menstrual period. Your periods may come earlier or later than usual. They may be lighter, have blood clots or be heavier. You may have bleeding between periods, or you may skip one period or more. You may have bleeding after sexual intercourse, bleeding  after menopause, or no menstrual period. CAUSES   Pregnancy (normal, miscarriage, tubal).  IUDs (intrauterine device, birth control).  Birth control pills.  Hormone treatment.  Menopause.  Infection of the cervix.  Blood clotting problems.  Infection of the inside lining of the uterus.  Endometriosis, inside lining of the uterus growing in the pelvis and other female organs.  Adhesions (scar tissue) inside the uterus.  Obesity or severe weight loss.  Uterine polyps inside the uterus.  Cancer of the vagina, cervix, or uterus.  Ovarian cysts or polycystic ovary syndrome.  Medical problems (diabetes, thyroid disease).  Uterine fibroids (noncancerous tumor).  Problems with your female hormones.  Endometrial hyperplasia, very thick lining and enlarged cells inside of the uterus.  Medicines that interfere with ovulation.  Radiation to the pelvis or abdomen.  Chemotherapy. DIAGNOSIS   Your doctor will discuss the history of your menstrual periods, medicines you are taking, changes in your weight, stress in your life, and any medical problems you may have.  Your doctor will do a physical and pelvic examination.  Your doctor may want to perform certain tests to make a diagnosis, such as:  Pap test.  Blood tests.  Cultures for infection.  CT scan.  Ultrasound.  Hysteroscopy.  Laparoscopy.  MRI.  Hysterosalpingography.  D and C.  Endometrial biopsy. TREATMENT  Treatment will depend on the cause of the dysfunctional uterine bleeding (DUB). Treatment may include:  Observing your menstrual periods for a couple of months.  Prescribing medicines for medical problems, including:  Antibiotics.  Hormones.  Birth control pills.  Removing an IUD (intrauterine device, birth control).  Surgery:  D and C (scrape and remove tissue from inside the uterus).  Laparoscopy (examine inside the abdomen with a lighted tube).  Uterine ablation (destroy  lining of the uterus with electrical current, laser, heat, or freezing).  Hysteroscopy (examine cervix and uterus with a lighted tube).  Hysterectomy (remove the uterus). HOME CARE INSTRUCTIONS   If medicines were prescribed, take exactly as directed. Do not change or switch medicines without consulting your caregiver.  Long term heavy bleeding may result in iron deficiency. Your caregiver may have prescribed iron pills. They help replace the iron that your body lost from heavy bleeding. Take exactly as directed.  Do not take aspirin or medicines that contain  aspirin one week before or during your menstrual period. Aspirin may make the bleeding worse.  If you need to change your sanitary pad or tampon more than once every 2 hours, stay in bed with your feet elevated and a cold pack on your lower abdomen. Rest as much as possible, until the bleeding stops or slows down.  Eat well-balanced meals. Eat foods high in iron. Examples are:  Leafy green vegetables.  Whole-grain breads and cereals.  Eggs.  Meat.  Liver.  Do not try to lose weight until the abnormal bleeding has stopped and your blood iron level is back to normal. Do not lift more than ten pounds or do strenuous activities when you are bleeding.  For a couple of months, make note on your calendar, marking the start and ending of your period, and the type of bleeding (light, medium, heavy, spotting, clots or missed periods). This is for your caregiver to better evaluate your problem. SEEK MEDICAL CARE IF:   You develop nausea (feeling sick to your stomach) and vomiting, dizziness, or diarrhea while you are taking your medicine.  You are getting lightheaded or weak.  You have any problems that may be related to the medicine you are taking.  You develop pain with your DUB.  You want to remove your IUD.  You want to stop or change your birth control pills or hormones.  You have any type of abnormal bleeding mentioned  above.  You are over 34 years old and have not had a menstrual period yet.  You are 46 years old and you are still having menstrual periods.  You have any of the symptoms mentioned above.  You develop a rash. SEEK IMMEDIATE MEDICAL CARE IF:   An oral temperature above 102 F (38.9 C) develops.  You develop chills.  You are changing your sanitary pad or tampon more than once an hour.  You develop abdominal pain.  You pass out or faint. Document Released: 09/19/2000 Document Revised: 12/15/2011 Document Reviewed: 08/21/2009 [facility] Patient Information 2013 [place], [place].

## 2012-09-27 NOTE — Progress Notes (Signed)
Subjective:     Patient ID: Cynthia [name], female   DOB: 07/07/1966, 46 y.o.   MRN: [id number]  HPI Pt was seen in the ED in early Dec with c/o heavy bleeding.  She reports that she has had 6months of irregular bleeding which has consisted of 2-3 months of no cycles followed by 2 months of heavy cycles assoc with clots.  Pt denies intermenstrual bleeding.  Pt was given Megace or Provera in the ED but stopped it when the bleeding stopped.  Pt also c/o hot flushes and mood changes (she reports that her daughter threatened to move out because of her mood changes)       Past Medical History  Diagnosis Date  . Hypertension   . Stroke   . Cholesterosis   PSHx: s/p sterilization  Current Outpatient Prescriptions on File Prior to Visit  Medication Sig Dispense Refill  . amLODipine (NORVASC) 5 MG tablet Take 5 mg by mouth daily.        . ARIPiprazole (ABILIFY) 2 MG tablet Take 2 mg by mouth daily.        . ferrous sulfate 325 (65 FE) MG tablet Take 1 tablet (325 mg total) by mouth daily.  30 tablet  0  . naproxen (NAPROSYN) 500 MG tablet Take 1 tablet (500 mg total) by mouth 2 (two) times daily.  30 tablet  0  . rivastigmine (EXELON) 9.5 mg/24hr Place 1 patch onto the skin daily.        . simvastatin (ZOCOR) 40 MG tablet Take 40 mg by mouth at bedtime.        . traZODone (DESYREL) 50 MG tablet Take 50 mg by mouth at bedtime as needed. For sleep.        History   Social History  . Marital Status: Divorced    Spouse Name: N/A    Number of Children: N/A  . Years of Education: N/A   Occupational History  . Not on file.   Social History Main Topics  . Smoking status: Never Smoker   . Smokeless tobacco: Not on file  . Alcohol Use: No  . Drug Use: No  . Sexually Active: No   Other Topics Concern  . Not on file   Social History Narrative  . No narrative on file    Review of Systems     Objective:   Physical ExamBP 127/85  Pulse 95  Temp 98.1 F (36.7 C) (Oral)  Ht 5' 8.5"  (1.74 m)  Wt 260 lb 12.8 oz (118.298 kg)  BMI 39.08 kg/m2  LMP 09/26/2012  Lungs: CTA CV:  RRR Abd: obese, soft, NT, ND GU: deferred  Findings: 09/06/12 Uterus: 11.0 x 5.0 x 7.4 cm. Heterogeneous echogenicity of  uterine myometrium noted. A fibroid is seen in the uterine fundus  measuring 2.0 cm in maximum diameter, whichis partially located in  the endometrial cavity and could be submucosal or intracavitary in  location. A subserosal fibroid is also seen in the left anterior  corpus measuring 2.5 cm.  Endometrium: Double layer thickness measures 12 mm transvaginally.  A 2 cm submucosal or intracavitary fibroid again noted in the  fundal region.  Right ovary: not directly visualized by transabdominal or  transvaginal sonography, however no adnexal mass identified.  Left ovary: not directly visualized by transabdominal or  transvaginal sonography, however no adnexal mass identified.  Other Findings: No free fluid  IMPRESSION:  1. Heterogeneous myometrium, with at least two distinct fibroids  measuring up to  2.5 cm. A 2 cm fibroid in the fundus may be  submucosal or intracavitary in location. Sonohysterogram should be  considered for further evaluation prior to hysteroscopy.  2. Nonvisualization of the ovaries, however no adnexal mass  identified.      Assessment:     Menorrhagia suspect perimenopause- pt needs endobx but is bleeding very heavy with clots.  Will treat and have pt f/u for Endo bx as sx are more suspicious for perimenopause.    Plan:     F/u for Endobx Megace 20mg  q day D/W exercise  I.e. Water aerobics Labs: TSH & CBC (pt left without checking out- will draw labs on f/u visit)  Shaquita [place] L. Harraway-Smith, M.D., [place]

## 2012-10-28 ENCOUNTER — Other Ambulatory Visit: Payer: Medicare Other | Admitting: Obstetrics & Gynecology

## 2012-10-28 ENCOUNTER — Telehealth: Payer: Self-pay | Admitting: *Deleted

## 2012-10-28 NOTE — Telephone Encounter (Signed)
Pt left a message stating that she wants to know what her appointment is about today.

## 2012-10-29 NOTE — Telephone Encounter (Signed)
Pt left a second message requesting info about the biopsy. I called her back and left a message to call us back if she still has a question.

## 2012-11-15 ENCOUNTER — Other Ambulatory Visit (HOSPITAL_COMMUNITY)
Admission: RE | Admit: 2012-11-15 | Discharge: 2012-11-15 | Disposition: A | Discharge status: Home / Self Care | Payer: Medicare Other | Source: Ambulatory Visit | Attending: Obstetrics & Gynecology | Admitting: Obstetrics & Gynecology

## 2012-11-15 ENCOUNTER — Ambulatory Visit (INDEPENDENT_AMBULATORY_CARE_PROVIDER_SITE_OTHER): Payer: Federal, State, Local not specified - PPO | Admitting: Obstetrics & Gynecology

## 2012-11-15 ENCOUNTER — Encounter: Payer: Self-pay | Admitting: Obstetrics & Gynecology

## 2012-11-15 VITALS — BP 127/73 | HR 93 | Temp 97.5°F | Ht 68.0 in

## 2012-11-15 DIAGNOSIS — N938 Other specified abnormal uterine and vaginal bleeding: Secondary | ICD-10-CM

## 2012-11-15 DIAGNOSIS — N949 Unspecified condition associated with female genital organs and menstrual cycle: Secondary | ICD-10-CM

## 2012-11-15 DIAGNOSIS — N925 Other specified irregular menstruation: Secondary | ICD-10-CM

## 2012-11-15 DIAGNOSIS — Z01812 Encounter for preprocedural laboratory examination: Secondary | ICD-10-CM

## 2012-11-15 NOTE — Patient Instructions (Addendum)

## 2012-11-15 NOTE — Progress Notes (Signed)
Patient ID: Cynthia [name], female   DOB: 18-Nov-1965, 47 y.o.   MRN: [id number]  Chief Complaint  Patient presents with  . Menorrhagia    needs endometrial biopsy    HPI Cynthia [name] is a 47 y.o. female.  Patient's last menstrual period was 10/28/2012. [id number] H/O BTL, stroke, DUB, fibroid uterus for endometrial biopsy. Has not taken Megace Rx.  Procedure and risk of EMB explained and questions were answered. HPI  Past Medical History  Diagnosis Date  . Hypertension   . Stroke   . Cholesterosis     History reviewed. No pertinent past surgical history.  Family History  Problem Relation Age of Onset  . Cancer Mother     Social History History  Substance Use Topics  . Smoking status: Never Smoker   . Smokeless tobacco: Never Used  . Alcohol Use: No    No Known Allergies  Current Outpatient Prescriptions  Medication Sig Dispense Refill  . amLODipine (NORVASC) 5 MG tablet Take 5 mg by mouth daily.        . ARIPiprazole (ABILIFY) 2 MG tablet Take 2 mg by mouth daily.        . Fe Fum-FePoly-FA-Vit C-Vit B3 (INTEGRA F) 125-1 MG CAPS Take 1 capsule by mouth daily.  30 capsule  1  . ferrous sulfate 325 (65 FE) MG tablet Take 1 tablet (325 mg total) by mouth daily.  30 tablet  0  . megestrol (MEGACE) 20 MG tablet Take 1 tablet (20 mg total) by mouth daily.  30 tablet  2  . naproxen (NAPROSYN) 500 MG tablet Take 1 tablet (500 mg total) by mouth 2 (two) times daily.  30 tablet  0  . rivastigmine (EXELON) 9.5 mg/24hr Place 1 patch onto the skin daily.        . simvastatin (ZOCOR) 40 MG tablet Take 40 mg by mouth at bedtime.        . traZODone (DESYREL) 50 MG tablet Take 50 mg by mouth at bedtime as needed. For sleep.        No current facility-administered medications for this visit.    Review of Systems Review of Systems  Genitourinary: Positive for menstrual problem and pelvic pain. Negative for vaginal bleeding and vaginal discharge.  Neurological: Positive for weakness  and numbness.    Blood pressure 127/73, pulse 93, temperature 97.5 F (36.4 C), height 5\' 8"  (1.727 m), last menstrual period 10/28/2012.  Physical Exam Physical Exam  Constitutional: She appears well-nourished. No distress.  Abdominal: There is no tenderness.  Genitourinary: Vagina normal and uterus normal. No vaginal discharge found.  Patient given informed consent, signed copy in the chart, time out was performed. Appropriate time out taken. . The patient was placed in the lithotomy position and the cervix brought into view with sterile speculum.  Portio of cervix cleansed x 2 with betadine swabs. Hurricaine spray applied.  The uterus was sounded for depth of 9cm. A pipelle was introduced to into the uterus, suction created,  and an endometrial sample was obtained. All equipment was removed and accounted for.  The patient tolerated the procedure well.        Data Reviewed *RADIOLOGY REPORT*  Clinical Data: Pelvic pain. Vaginal bleeding. LMP 09/03/2012.  TRANSABDOMINAL AND TRANSVAGINAL ULTRASOUND OF PELVIS  Technique: Both transabdominal and transvaginal ultrasound  examinations of the pelvis were performed. Transabdominal  technique was performed for global imaging of the pelvis including  uterus, ovaries, adnexal regions, and pelvic cul-de-sac.  It was necessary to  proceed with endovaginal exam following the  transabdominal exam to visualize the endometrium and ovaries.  Comparison: None.  Findings:  Uterus: 11.0 x 5.0 x 7.4 cm. Heterogeneous echogenicity of  uterine myometrium noted. A fibroid is seen in the uterine fundus  measuring 2.0 cm in maximum diameter, whichis partially located in  the endometrial cavity and could be submucosal or intracavitary in  location. A subserosal fibroid is also seen in the left anterior  corpus measuring 2.5 cm.  Endometrium: Double layer thickness measures 12 mm transvaginally.  A 2 cm submucosal or intracavitary fibroid again noted in the   fundal region.  Right ovary: not directly visualized by transabdominal or  transvaginal sonography, however no adnexal mass identified.  Left ovary: not directly visualized by transabdominal or  transvaginal sonography, however no adnexal mass identified.  Other Findings: No free fluid  IMPRESSION:  1. Heterogeneous myometrium, with at least two distinct fibroids  measuring up to 2.5 cm. A 2 cm fibroid in the fundus may be  submucosal or intracavitary in location. Sonohysterogram should be  considered for further evaluation prior to hysteroscopy.  2. Nonvisualization of the ovaries, however no adnexal mass  identified.  Original Report Authenticated By: Myles Rosenthal, M.D.    Assessment    DUB submucous fibroid     Plan   Discussed HTA procedure and info given Patient given post procedure instructions. The patient will return in 2 weeks for results.        Cynthia [name] 11/15/2012, 5:12 PM

## 2012-11-17 ENCOUNTER — Encounter: Payer: Self-pay | Admitting: Obstetrics & Gynecology

## 2012-11-23 ENCOUNTER — Encounter: Payer: Self-pay | Admitting: *Deleted

## 2012-11-24 ENCOUNTER — Telehealth: Payer: Self-pay | Admitting: General Practice

## 2012-11-24 NOTE — Telephone Encounter (Signed)
Pt called and left message stating she wanted the results from her recent biopsy. Called patient back, no answer, left message stating we were returning her phone call and to please give [place] a call back.

## 2012-11-25 NOTE — Telephone Encounter (Signed)
Called patient and informed her that I received her message. Patient stated she wanted her endometrial biopsy results. Told patient that the results were normal. Patient verbalized understanding, was satisfied and had no further questions

## 2013-02-10 ENCOUNTER — Encounter (HOSPITAL_COMMUNITY): Payer: Self-pay

## 2013-02-10 ENCOUNTER — Emergency Department (HOSPITAL_COMMUNITY): Payer: Medicare Other

## 2013-02-10 ENCOUNTER — Emergency Department (HOSPITAL_COMMUNITY)
Admission: EM | Admit: 2013-02-10 | Discharge: 2013-02-11 | Disposition: A | Discharge status: Home / Self Care | Payer: Medicare Other | Attending: Emergency Medicine | Admitting: Emergency Medicine

## 2013-02-10 DIAGNOSIS — Z79899 Other long term (current) drug therapy: Secondary | ICD-10-CM | POA: Insufficient documentation

## 2013-02-10 DIAGNOSIS — Z8742 Personal history of other diseases of the female genital tract: Secondary | ICD-10-CM | POA: Insufficient documentation

## 2013-02-10 DIAGNOSIS — D649 Anemia, unspecified: Secondary | ICD-10-CM | POA: Insufficient documentation

## 2013-02-10 DIAGNOSIS — I10 Essential (primary) hypertension: Secondary | ICD-10-CM | POA: Insufficient documentation

## 2013-02-10 DIAGNOSIS — Z8673 Personal history of transient ischemic attack (TIA), and cerebral infarction without residual deficits: Secondary | ICD-10-CM | POA: Insufficient documentation

## 2013-02-10 HISTORY — DX: Anemia, unspecified: D64.9

## 2013-02-10 LAB — ABO/RH: ABO/RH(D): A POS

## 2013-02-10 LAB — URINALYSIS, ROUTINE W REFLEX MICROSCOPIC
Glucose, UA: NEGATIVE mg/dL
Hgb urine dipstick: NEGATIVE
Leukocytes, UA: NEGATIVE
Protein, ur: NEGATIVE mg/dL
Specific Gravity, Urine: 1.013 (ref 1.005–1.030)

## 2013-02-10 LAB — BASIC METABOLIC PANEL
CO2: 21 mEq/L (ref 19–32)
Calcium: 9.3 mg/dL (ref 8.4–10.5)
Chloride: 105 mEq/L (ref 96–112)
Glucose, Bld: 100 mg/dL — ABNORMAL HIGH (ref 70–99)
Potassium: 3.2 mEq/L — ABNORMAL LOW (ref 3.5–5.1)
Sodium: 137 mEq/L (ref 135–145)

## 2013-02-10 LAB — CBC WITH DIFFERENTIAL/PLATELET
Basophils Relative: 0 % (ref 0–1)
Eosinophils Relative: 2 % (ref 0–5)
HCT: 23.7 % — ABNORMAL LOW (ref 36.0–46.0)
Hemoglobin: 6.3 g/dL — CL (ref 12.0–15.0)
Lymphocytes Relative: 29 % (ref 12–46)
MCHC: 26.6 g/dL — ABNORMAL LOW (ref 30.0–36.0)
Monocytes Relative: 6 % (ref 3–12)
Neutro Abs: 4.4 10*3/uL (ref 1.7–7.7)
Neutrophils Relative %: 63 % (ref 43–77)
RBC: 3.75 MIL/uL — ABNORMAL LOW (ref 3.87–5.11)

## 2013-02-10 LAB — PREPARE RBC (CROSSMATCH)

## 2013-02-10 MED ORDER — FERROUS SULFATE 325 (65 FE) MG PO TABS: 325.0000 mg | ORAL_TABLET | Freq: Every day | ORAL | Status: DC

## 2013-02-10 MED ORDER — NORGESTIM-ETH ESTRAD TRIPHASIC 0.18/0.215/0.25 MG-25 MCG PO TABS: 1.0000 | ORAL_TABLET | Freq: Every day | ORAL | Status: DC

## 2013-02-10 MED ORDER — FERROUS SULFATE 75 (15 FE) MG/ML PO SOLN: 15.0000 mg | Freq: Once | ORAL | Status: DC

## 2013-02-10 MED ORDER — FERROUS SULFATE 300 (60 FE) MG/5ML PO SYRP
75.0000 mg | ORAL_SOLUTION | Freq: Once | ORAL | Status: AC
  Administered 2013-02-10: 78 mg via ORAL
  Filled 2013-02-10: qty 5

## 2013-02-10 MED ORDER — FERROUS SULFATE 75 (15 FE) MG/ML PO SOLN
15.0000 mg | Freq: Once | ORAL | Status: DC
  Filled 2013-02-10: qty 1

## 2013-02-10 MED ORDER — SODIUM CHLORIDE 0.9 % IV BOLUS (SEPSIS)
1000.0000 mL | Freq: Once | INTRAVENOUS | Status: AC
  Administered 2013-02-10: 1000 mL via INTRAVENOUS

## 2013-02-10 MED ORDER — MECLIZINE HCL 25 MG PO TABS
25.0000 mg | ORAL_TABLET | Freq: Once | ORAL | Status: AC
  Administered 2013-02-10: 25 mg via ORAL
  Filled 2013-02-10: qty 1

## 2013-02-10 NOTE — ED Notes (Signed)
Pt is currently refusing IV and blood transfusion.  PA notified.  PA is going to speak with Pt.

## 2013-02-10 NOTE — ED Provider Notes (Signed)
History     CSN: [id number]  Arrival date & time 02/10/13  1349   First MD Initiated Contact with Patient 02/10/13 1409      Chief Complaint  Patient presents with  . Dizziness    (Consider location/radiation/quality/duration/timing/severity/associated sxs/prior treatment) HPI Comments: Patient is a 47 year old [name] with a past medical history of previous CVA and dysfunctional uterine bleeding from uterine fibroids who presents with a 1 month history of dizziness. Symptoms have been intermittent for the past month. Patient reports sudden onset of dizziness, described as lightheadedness, that will last about 1 hour and spontaneously resolve. Patient reports that exerting herself will occasionally trigger the symptoms and rest will relieve them, but not all the time. No other associated symptoms.    Past Medical History  Diagnosis Date  . Hypertension   . Stroke   . Cholesterosis     History reviewed. No pertinent past surgical history.  Family History  Problem Relation Age of Onset  . Cancer Mother     History  Substance Use Topics  . Smoking status: Never Smoker   . Smokeless tobacco: Never Used  . Alcohol Use: No    OB History   Grav Para Term Preterm Abortions TAB SAB Ect Mult Living   3 3 3  0 0 0 0 0 0 3      Review of Systems  Neurological: Positive for dizziness and light-headedness.  All other systems reviewed and are negative.    Allergies  Review of patient's allergies indicates no known allergies.  Home Medications   Current Outpatient Rx  Name  Route  Sig  Dispense  Refill  . amLODipine (NORVASC) 5 MG tablet   Oral   Take 5 mg by mouth daily.           . ARIPiprazole (ABILIFY) 2 MG tablet   Oral   Take 2 mg by mouth daily.           . Fe Fum-FePoly-FA-Vit C-Vit B3 (INTEGRA F) 125-1 MG CAPS   Oral   Take 1 capsule by mouth daily.   30 capsule   1   . ferrous sulfate 325 (65 FE) MG tablet   Oral   Take 1 tablet (325 mg total) by  mouth daily.   30 tablet   0   . megestrol (MEGACE) 20 MG tablet   Oral   Take 1 tablet (20 mg total) by mouth daily.   30 tablet   2   . naproxen (NAPROSYN) 500 MG tablet   Oral   Take 1 tablet (500 mg total) by mouth 2 (two) times daily.   30 tablet   0   . rivastigmine (EXELON) 9.5 mg/24hr   Transdermal   Place 1 patch onto the skin daily.           . simvastatin (ZOCOR) 40 MG tablet   Oral   Take 40 mg by mouth at bedtime.           . traZODone (DESYREL) 50 MG tablet   Oral   Take 50 mg by mouth at bedtime as needed. For sleep.            BP 111/76  Pulse 98  Temp(Src) 98.3 F (36.8 C) (Oral)  Resp 18  Ht 5\' 9"  (1.753 m)  Wt 250 lb (113.399 kg)  BMI 36.9 kg/m2  SpO2 96%  LMP 01/30/2013  Physical Exam  Nursing note and vitals reviewed. Constitutional: She is oriented to  person, place, and time. She appears well-developed and well-nourished. No distress.  HENT:  Head: Normocephalic and atraumatic.  Eyes: Conjunctivae and EOM are normal. Pupils are equal, round, and reactive to light.  No nystagmus noted with EOM  Neck: Normal range of motion.  Cardiovascular: Normal rate and regular rhythm.  Exam reveals no gallop and no friction rub.   No murmur heard. Pulmonary/Chest: Effort normal and breath sounds normal. She has no wheezes. She has no rales. She exhibits no tenderness.  Abdominal: Soft. She exhibits no distension. There is no tenderness. There is no rebound.  Musculoskeletal: Normal range of motion.  Neurological: She is alert and oriented to person, place, and time. No cranial nerve deficit. Coordination normal.  Left side extremity weakness due to previous stroke. Speech is goal-oriented. Moves limbs without ataxia.   Skin: Skin is warm and dry.  Psychiatric: She has a normal mood and affect. Her behavior is normal.    ED Course  Procedures (including critical care time)   Date: 02/10/2013  Rate: 98  Rhythm: normal sinus rhythm  QRS  Axis: normal  Intervals: normal  ST/T Wave abnormalities: nonspecific T wave changes  Conduction Disutrbances:none  Narrative Interpretation: NSR with nonspecific T wave changes without previous for comparison  Old EKG Reviewed: none available    CRITICAL CARE Performed by: Emilia Beck   Total critical care time: 30 minutes  Critical care time was exclusive of separately billable procedures and treating other patients.  Critical care was necessary to treat or prevent imminent or life-threatening deterioration.  Critical care was time spent personally by me on the following activities: development of treatment plan with patient and/or surrogate as well as nursing, discussions with consultants, evaluation of patient's response to treatment, examination of patient, obtaining history from patient or surrogate, ordering and performing treatments and interventions, ordering and review of laboratory studies, ordering and review of radiographic studies, pulse oximetry and re-evaluation of patient's condition.   Labs Reviewed  CBC WITH DIFFERENTIAL - Abnormal; Notable for the following:    RBC 3.75 (*)    Hemoglobin 6.3 (*)    HCT 23.7 (*)    MCV 63.2 (*)    MCH 16.8 (*)    MCHC 26.6 (*)    RDW 20.5 (*)    All other components within normal limits  BASIC METABOLIC PANEL - Abnormal; Notable for the following:    Potassium 3.2 (*)    Glucose, Bld 100 (*)    All other components within normal limits  URINALYSIS, ROUTINE W REFLEX MICROSCOPIC  ABO/RH  PREPARE RBC (CROSSMATCH)  TYPE AND SCREEN   Ct Head Wo Contrast  02/10/2013  *RADIOLOGY REPORT*  Clinical Data: Dizziness and headache  CT HEAD WITHOUT CONTRAST  Technique:  Contiguous axial images were obtained from the base of the skull through the vertex without contrast.  Comparison: None  Findings: The brain has a normal appearance without evidence for hemorrhage, infarction, hydrocephalus, or mass lesion.  There is no extra axial  fluid collection.  The skull and paranasal sinuses are normal.  IMPRESSION: Negative exam.   Original Report Authenticated By: Signa Kell, M.D.      1. Anemia requiring transfusions       MDM  3:06 PM Patient's labs shows hemoglobin of 6.3 with unremarkable remaining labs. Patient will have ABO/Rh and have an IV started.   Patient will have 2 units of RBCs in the ED and be discharged following an observation period after receiving blood. Patient will  have a recommended follow up appointment with her PCP and OBGYN regarding your anemia and dysfunctional uterine bleeding. Patient signed out to Dr. Hyacinth Meeker for disposition.       Emilia Beck, PA-C 02/13/13 [phone]  Shared service with midlevel provider. I have personally seen and examined the patient, providing direct face to face care, presenting with the chief complaint of dizziness, bleeding. Physical exam findings include some weakness, otherwise non focal exam. Plan will be to transfuse blood, as patient is symptomatic anemia. I have reviewed the nursing documentation on past medical history, family history, and social history.  CRITICAL CARE Performed by: Derwood Kaplan   Total critical care time: 30 minutes  Critical care time was exclusive of separately billable procedures and treating other patients.  Critical care was necessary to treat or prevent imminent or life-threatening deterioration.  Critical care was time spent personally by me on the following activities: development of treatment plan with patient and/or surrogate as well as nursing, discussions with consultants, evaluation of patient's response to treatment, examination of patient, obtaining history from patient or surrogate, ordering and performing treatments and interventions, ordering and review of laboratory studies, ordering and review of radiographic studies, pulse oximetry and re-evaluation of patient's condition.   Derwood Kaplan, MD 02/15/13  [phone]

## 2013-02-10 NOTE — ED Notes (Signed)
Spoke with md miller about the concern of temp elevate since arrival and while giving blood. He states that if temp goes higher than 100.5 to stop blood and let him know. Informed primary nurse alaina of this.

## 2013-02-10 NOTE — ED Notes (Signed)
PA notified of temp and directed to continue with transfusion.

## 2013-02-10 NOTE — ED Notes (Signed)
Second unit of RBCs transfused. Pt tolerated well. Pt to stay until about a half hour for additional observation per Dr. Hyacinth Meeker. Pt agreeable. VSS. Family at bedside with pt.

## 2013-02-10 NOTE — ED Notes (Addendum)
ABO/RH lab work not collected, this Clinical research associate accidentally clicked it off.

## 2013-02-10 NOTE — ED Notes (Signed)
Pt presents with dizziness that started yesterday. Pt has been experiencing dizziness on and off for about a month now. Pt has felt like she was going to pass out but never actually did. Pt ambulates with a cane. Pt has also been experiencing headaches and blurred vision for about a month as well.

## 2013-02-10 NOTE — ED Provider Notes (Signed)
  Physical Exam  BP 166/95  Pulse 82  Temp(Src) 99.2 F (37.3 C) (Oral)  Resp 23  Ht 5\' 9"  (1.753 m)  Wt 250 lb (113.399 kg)  BMI 36.9 kg/m2  SpO2 99%  LMP 01/30/2013  Physical Exam  ED Course  Procedures  MDM Pt has received transfusions, is stable for d/c, no reaction to transfusions.        Vida Roller, MD 02/10/13 [phone]

## 2013-02-11 LAB — TYPE AND SCREEN
Antibody Screen: NEGATIVE
Unit division: 0

## 2013-02-15 ENCOUNTER — Telehealth (HOSPITAL_COMMUNITY): Payer: Self-pay | Admitting: Emergency Medicine

## 2013-02-15 NOTE — ED Notes (Signed)
Pt called lost Rx.  Can we call in to pharmacy for her.  Rx x 2 called to [facility] [phone] and given to pharmacist.

## 2014-06-23 ENCOUNTER — Encounter (HOSPITAL_COMMUNITY): Payer: Self-pay | Admitting: Emergency Medicine

## 2014-06-23 ENCOUNTER — Emergency Department (HOSPITAL_COMMUNITY)
Admission: EM | Admit: 2014-06-23 | Discharge: 2014-06-23 | Discharge status: Against Medical Advice | Payer: Medicare Other | Attending: Emergency Medicine | Admitting: Emergency Medicine

## 2014-06-23 DIAGNOSIS — M79609 Pain in unspecified limb: Secondary | ICD-10-CM | POA: Insufficient documentation

## 2014-06-23 DIAGNOSIS — I10 Essential (primary) hypertension: Secondary | ICD-10-CM | POA: Insufficient documentation

## 2014-06-23 DIAGNOSIS — I998 Other disorder of circulatory system: Secondary | ICD-10-CM | POA: Diagnosis present

## 2014-06-23 NOTE — ED Notes (Signed)
Patient 3rd call to treatment room. Patient does not respond. Patient discharged LWBS after traige at this time.

## 2014-06-23 NOTE — ED Notes (Signed)
Pt presents with c/o poor circulation in both of her legs. Pt reports that these symptoms have been going on off and on for about a year. Pt reports that it has gotten progressively worse recently and she has been unable to sleep. Pt reports that her legs are very painful.

## 2014-06-23 NOTE — ED Notes (Signed)
Called x one for room with no response.

## 2014-06-23 NOTE — ED Notes (Signed)
Patient called to treatment room. No answer. 2nd call.

## 2014-08-07 ENCOUNTER — Encounter (HOSPITAL_COMMUNITY): Payer: Self-pay | Admitting: Emergency Medicine

## 2014-10-01 ENCOUNTER — Emergency Department (HOSPITAL_COMMUNITY)
Admission: EM | Admit: 2014-10-01 | Discharge: 2014-10-01 | Disposition: A | Discharge status: Home / Self Care | Payer: Medicare Other | Attending: Emergency Medicine | Admitting: Emergency Medicine

## 2014-10-01 ENCOUNTER — Encounter (HOSPITAL_COMMUNITY): Payer: Self-pay

## 2014-10-01 DIAGNOSIS — Z8719 Personal history of other diseases of the digestive system: Secondary | ICD-10-CM | POA: Diagnosis not present

## 2014-10-01 DIAGNOSIS — D649 Anemia, unspecified: Secondary | ICD-10-CM | POA: Diagnosis not present

## 2014-10-01 DIAGNOSIS — L02415 Cutaneous abscess of right lower limb: Secondary | ICD-10-CM | POA: Diagnosis not present

## 2014-10-01 DIAGNOSIS — Z7982 Long term (current) use of aspirin: Secondary | ICD-10-CM | POA: Insufficient documentation

## 2014-10-01 DIAGNOSIS — H9313 Tinnitus, bilateral: Secondary | ICD-10-CM | POA: Diagnosis present

## 2014-10-01 DIAGNOSIS — Z79899 Other long term (current) drug therapy: Secondary | ICD-10-CM | POA: Insufficient documentation

## 2014-10-01 DIAGNOSIS — Z8673 Personal history of transient ischemic attack (TIA), and cerebral infarction without residual deficits: Secondary | ICD-10-CM | POA: Insufficient documentation

## 2014-10-01 DIAGNOSIS — L0291 Cutaneous abscess, unspecified: Secondary | ICD-10-CM

## 2014-10-01 DIAGNOSIS — I10 Essential (primary) hypertension: Secondary | ICD-10-CM | POA: Insufficient documentation

## 2014-10-01 MED ORDER — LIDOCAINE HCL (PF) 1 % IJ SOLN
5.0000 mL | Freq: Once | INTRAMUSCULAR | Status: AC
  Administered 2014-10-01: 5 mL via INTRADERMAL
  Filled 2014-10-01: qty 5

## 2014-10-01 NOTE — ED Notes (Signed)
[place] and I&D tray at bedside.

## 2014-10-01 NOTE — ED Provider Notes (Signed)
CSN: [id number]     Arrival date & time 10/01/14  1819 History   First MD Initiated Contact with Patient 10/01/14 2014     Chief Complaint  Patient presents with  . Tinnitus  . Abscess    (Consider location/radiation/quality/duration/timing/severity/associated sxs/prior Treatment) HPI Cynthia [name] is a 48 yo female presenting with report of persistent ear ringing right greater than left x several years.  She had a stroke with left sided residual weakness in 2010 and notes the ringing started shortly after.  She has discussed the ear ringing with her PCP Dr. Kennon Holter and was seen by an ENT doctor who prescribed an OTC remedy that did not help.  The ringing has worsened in the past year but has not changed recently.  It is just continuous and bothersome to her especially at night time. She also reports 3 small abscesses on her thighs that are becoming more painful.  She tends to get small abscesses intermittently in her groin and axilla but theses have not improved.  There are two on her left thigh and one on her right thigh.  She endorses some mild nausea intermittently but denies fever, chills, vomiting, blurred vision, changes in speech or new focal neuro deficits.    Past Medical History  Diagnosis Date  . Hypertension   . Stroke   . Cholesterosis   . Anemia    History reviewed. No pertinent past surgical history. Family History  Problem Relation Age of Onset  . Cancer Mother    History  Substance Use Topics  . Smoking status: Never Smoker   . Smokeless tobacco: Never Used  . Alcohol Use: No   OB History    Gravida Para Term Preterm AB TAB SAB Ectopic Multiple Living   3 3 3  0 0 0 0 0 0 3     Review of Systems  Constitutional: Negative for fever and chills.  HENT: Positive for tinnitus. Negative for sore throat.   Eyes: Negative for visual disturbance.  Respiratory: Negative for cough and shortness of breath.   Cardiovascular: Negative for chest pain and leg swelling.   Gastrointestinal: Positive for nausea. Negative for vomiting and diarrhea.  Genitourinary: Negative for dysuria.  Musculoskeletal: Negative for myalgias.  Skin: Positive for rash.  Neurological: Positive for weakness ( Left sided form residual stroke). Negative for numbness and headaches.      Allergies  Review of patient's allergies indicates no known allergies.  Home Medications   Prior to Admission medications   Medication Sig Start Date End Date Taking? Authorizing Provider  amLODipine (NORVASC) 5 MG tablet Take 5 mg by mouth daily.     Yes Historical Provider, MD  aspirin 81 MG tablet Take 81 mg by mouth daily.   Yes Historical Provider, MD  ferrous sulfate 325 (65 FE) MG tablet Take 1 tablet (325 mg total) by mouth daily. 02/10/13  Yes Johnna Acosta, MD  Norgestimate-Ethinyl Estradiol Triphasic (ORTHO TRI-CYCLEN LO) 0.18/0.215/0.25 MG-25 MCG tab Take 1 tablet by mouth daily. 02/10/13  Yes Johnna Acosta, MD  rivastigmine (EXELON) 9.5 mg/24hr Place 1 patch onto the skin daily.     Yes Historical Provider, MD  simvastatin (ZOCOR) 40 MG tablet Take 40 mg by mouth at bedtime.     Yes Historical Provider, MD  traZODone (DESYREL) 50 MG tablet Take 50 mg by mouth at bedtime as needed. For sleep.    Yes Historical Provider, MD   BP 162/86 mmHg  Pulse 95  Temp(Src) 99 F (  37.2 C) (Oral)  Resp 19  Ht 5\' 8"  (1.727 m)  Wt 240 lb (108.863 kg)  BMI 36.50 kg/m2  SpO2 100% Physical Exam  Constitutional: She is oriented to person, place, and time. She appears well-developed and well-nourished. No distress.  HENT:  Head: Normocephalic and atraumatic.  Mouth/Throat: Oropharynx is clear and moist. No oropharyngeal exudate.  Eyes: Conjunctivae are normal.  Neck: Neck supple. No thyromegaly present.  Cardiovascular: Normal rate, regular rhythm and intact distal pulses.   Pulmonary/Chest: Effort normal and breath sounds normal. No respiratory distress. She has no wheezes. She has no rales. She  exhibits no tenderness.  Abdominal: Soft. There is no tenderness.  Musculoskeletal: She exhibits no tenderness.  Lymphadenopathy:    She has no cervical adenopathy.  Neurological: She is alert and oriented to person, place, and time.  Skin: Skin is warm and dry. Rash noted. She is not diaphoretic.  Two areas of fluctuance appr 1 cm in diameter on left medial thigh,  One area of fluctuance 2 cm in diameter on right medial thigh.  Psychiatric: She has a normal mood and affect.  Nursing note and vitals reviewed.   ED Course  Procedures (including critical care time) EMERGENCY DEPARTMENT US SOFT TISSUE INTERPRETATION "Study: Limited Ultrasound of the noted body part in comments below"  INDICATIONS: Pain Multiple views of the body part are obtained with a multi-frequency linear probe  PERFORMED BY:  Myself  IMAGES ARCHIVED?: Yes  SIDE:Right   BODY PART:Lower extremity  FINDINGS: Abcess present and Cellulitis absent  LIMITATIONS:  Body Habitus  INTERPRETATION:  Abcess present  COMMENT:  None  INCISION AND DRAINAGE Performed by: Britt Bottom Consent: Verbal consent obtained. Risks and benefits: risks, benefits and alternatives were discussed Type: abscess  Body area: Right medial thigh  Anesthesia: local infiltration  Incision was made with a scalpel.  Local anesthetic: lidocaine 1% without epinephrine  Anesthetic total: 1 ml  Complexity: complex Blunt dissection to break up loculations  Drainage: purulent  Drainage amount: small  Packing material: none  Patient tolerance: Patient tolerated the procedure well with no immediate complications.    Labs Review Labs Reviewed - No data to display  Imaging Review No results found.   EKG Interpretation None      MDM   Final diagnoses:  Abscess   48 yo with chronic and unchanged tinnitus x several years, recently evaluated by ENT, also with worsening abscesses. 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 Discussed warm soaks at home and wound recheck in 2 days. No signs of cellulitis is surrounding skin.  Will d/c to home.  No antibiotic therapy is indicated. Pt encouraged to follow-up with PCP and ENT regarding continued tinnitus. Pt is well-appearing, in no acute distress and vital signs are stable.  They appear safe to be discharged.    Return precautions provided.  Pt aware of plan and in agreement.   Filed Vitals:   10/01/14 1842 10/01/14 2026 10/01/14 2100 10/01/14 2208  BP: 154/74 162/86 144/71 150/94  Pulse: 101 95 89 82  Temp: 99 F (37.2 C)     TempSrc: Oral     Resp: 20 19 20 18   Height: 5\' 8"  (1.727 m)     Weight: 240 lb (108.863 kg)     SpO2: 97% 100% 100% 100%   Meds given in ED:  Medications  lidocaine (PF) (XYLOCAINE) 1 % injection 5  mL (5 mLs Intradermal Given 10/01/14 2126)    Discharge Medication List as of 10/01/2014  9:59 PM         Britt Bottom, NP 10/02/14 [place]. Alvino Chapel, MD 10/04/14 2109

## 2014-10-01 NOTE — Discharge Instructions (Signed)
Please follow the directions provided.  Be sure to follow-up with Dr. Kennon Holter to re-check your wound.  Use warm soaks at home on this wound with soapy water to encourage it to drain.  Also follow-up with the ENT doctor regarding the ringing in your ears.  If your symptoms change or worsen in any way don't hesitate to return to the ED.    SEEK MEDICAL CARE IF:  You have increased pain, swelling, redness, fluid drainage, or bleeding.  You have muscle aches, chills, or a general ill feeling.  You have a fever.

## 2014-10-01 NOTE — ED Notes (Signed)
Pt reports she's had ringing in her ears since 2010 when she suffered a stroke.  Pt states the ringing is getting worse.  Pt reports the ringing in the R ear is greater than the L ear.  Pt also c/o boils to bilateral inner thighs.

## 2014-12-02 ENCOUNTER — Emergency Department (HOSPITAL_COMMUNITY)
Admission: EM | Admit: 2014-12-02 | Discharge: 2014-12-02 | Disposition: A | Discharge status: Home / Self Care | Payer: Medicare Other | Attending: Emergency Medicine | Admitting: Emergency Medicine

## 2014-12-02 ENCOUNTER — Encounter (HOSPITAL_COMMUNITY): Payer: Self-pay | Admitting: Emergency Medicine

## 2014-12-02 DIAGNOSIS — M545 Low back pain: Secondary | ICD-10-CM | POA: Insufficient documentation

## 2014-12-02 DIAGNOSIS — M549 Dorsalgia, unspecified: Secondary | ICD-10-CM | POA: Diagnosis present

## 2014-12-02 DIAGNOSIS — D649 Anemia, unspecified: Secondary | ICD-10-CM | POA: Insufficient documentation

## 2014-12-02 DIAGNOSIS — Z8719 Personal history of other diseases of the digestive system: Secondary | ICD-10-CM | POA: Diagnosis not present

## 2014-12-02 DIAGNOSIS — M5489 Other dorsalgia: Secondary | ICD-10-CM

## 2014-12-02 DIAGNOSIS — M542 Cervicalgia: Secondary | ICD-10-CM | POA: Insufficient documentation

## 2014-12-02 DIAGNOSIS — Z8673 Personal history of transient ischemic attack (TIA), and cerebral infarction without residual deficits: Secondary | ICD-10-CM | POA: Insufficient documentation

## 2014-12-02 DIAGNOSIS — H9203 Otalgia, bilateral: Secondary | ICD-10-CM | POA: Insufficient documentation

## 2014-12-02 DIAGNOSIS — I10 Essential (primary) hypertension: Secondary | ICD-10-CM | POA: Insufficient documentation

## 2014-12-02 DIAGNOSIS — Z7982 Long term (current) use of aspirin: Secondary | ICD-10-CM | POA: Insufficient documentation

## 2014-12-02 MED ORDER — HYDROCODONE-ACETAMINOPHEN 5-325 MG PO TABS: 1.0000 | ORAL_TABLET | ORAL | Status: DC | PRN

## 2014-12-02 MED ORDER — HYDROCODONE-ACETAMINOPHEN 5-325 MG PO TABS
1.0000 | ORAL_TABLET | Freq: Once | ORAL | Status: AC
  Administered 2014-12-02: 1 via ORAL
  Filled 2014-12-02: qty 1

## 2014-12-02 MED ORDER — CYCLOBENZAPRINE HCL 10 MG PO TABS
10.0000 mg | ORAL_TABLET | Freq: Once | ORAL | Status: AC
Start: 2014-12-02 — End: 2014-12-02
  Administered 2014-12-02: 10 mg via ORAL
  Filled 2014-12-02: qty 1

## 2014-12-02 MED ORDER — CYCLOBENZAPRINE HCL 10 MG PO TABS: 10.0000 mg | ORAL_TABLET | Freq: Two times a day (BID) | ORAL | Status: DC | PRN

## 2014-12-02 NOTE — ED Notes (Signed)
Pt c/o upper rt sided back pain.  Denies injury.  States that she has had this pain before but it is worse today.  States that her rt ear is ringing.

## 2014-12-02 NOTE — ED Notes (Signed)
Pt has a ride home.  

## 2014-12-02 NOTE — ED Provider Notes (Signed)
CSN: [id number]     Arrival date & time 12/02/14  9563 History   This chart was scribed for non-physician practitioner, Charlann Lange, PA-C, working with Arbie Cookey, MD, by Chester Holstein, ED Scribe. This patient was seen in room WTR6/WTR6 and the patient's care was started at 8:27 PM.    Chief Complaint  Patient presents with  . Back Pain  . Otalgia     Patient is a 49 y.o. female presenting with back pain and ear pain. The history is provided by the patient. No language interpreter was used.  Back Pain Associated symptoms: no fever   Otalgia Associated symptoms: neck pain and tinnitus   Associated symptoms: no fever    HPI Comments: Cynthia [name] is a 49 y.o. female with PMHx of HTN, anemia, and cholesterosis who presents to the Emergency Department complaining of recurrent right sided back pain worsening yesterday. Pt notes she has been in bed all day due to pain. Pt notes associated neck pain radiating to right shoulder but denies radiate to right LE. Pt note movement aggravates the pain. . Pt denies any recent injury. Pt with h/o CVA, weakness in left side but able to ambulate with a cane. Pt denies difficulty urinating and fever Pt also notes tinnitus in right ear for several months. Pt has seen PCP for same with referral to ENT clinic. Pt has been unable to schedule an appointment with the ENT specialist. Pt has NKDA. Pt's PCP is Dr. Kennon Holter.   Past Medical History  Diagnosis Date  . Hypertension   . Stroke   . Cholesterosis   . Anemia    No past surgical history on file. Family History  Problem Relation Age of Onset  . Cancer Mother    History  Substance Use Topics  . Smoking status: Never Smoker   . Smokeless tobacco: Never Used  . Alcohol Use: No   OB History    Gravida Para Term Preterm AB TAB SAB Ectopic Multiple Living   3 3 3  0 0 0 0 0 0 3     Review of Systems  Constitutional: Negative for fever.  HENT: Positive for tinnitus.   Genitourinary:  Negative for difficulty urinating.  Musculoskeletal: Positive for myalgias, back pain, gait problem and neck pain.      Allergies  Review of patient's allergies indicates no known allergies.  Home Medications   Prior to Admission medications   Medication Sig Start Date End Date Taking? Authorizing Provider  amLODipine (NORVASC) 5 MG tablet Take 5 mg by mouth daily.     Yes Historical Provider, MD  Ascorbic Acid (VITAMIN C) 1000 MG tablet Take 1,000 mg by mouth daily.   Yes Historical Provider, MD  aspirin 81 MG tablet Take 81 mg by mouth daily.   Yes Historical Provider, MD  cyclobenzaprine (FLEXERIL) 10 MG tablet Take 10 mg by mouth 3 (three) times daily as needed for muscle spasms.  10/10/14  Yes Historical Provider, MD  ferrous sulfate 325 (65 FE) MG tablet Take 1 tablet (325 mg total) by mouth daily. 02/10/13  Yes Johnna Acosta, MD  Multiple Vitamin (MULTIVITAMIN WITH MINERALS) TABS tablet Take 1 tablet by mouth daily.   Yes Historical Provider, MD  rivastigmine (EXELON) 9.5 mg/24hr Place 1 patch onto the skin daily.     Yes Historical Provider, MD  simvastatin (ZOCOR) 40 MG tablet Take 40 mg by mouth at bedtime.     Yes Historical Provider, MD  traZODone (DESYREL) 50  MG tablet Take 50 mg by mouth at bedtime as needed. For sleep.    Yes Historical Provider, MD  Norgestimate-Ethinyl Estradiol Triphasic (ORTHO TRI-CYCLEN LO) 0.18/0.215/0.25 MG-25 MCG tab Take 1 tablet by mouth daily. Patient not taking: Reported on 12/02/2014 02/10/13   Johnna Acosta, MD   BP 130/72 mmHg  Pulse 86  Temp(Src) 98.7 F (37.1 C) (Oral)  Resp 20  SpO2 100% Physical Exam  Constitutional: She is oriented to person, place, and time. She appears well-developed and well-nourished.  HENT:  Head: Normocephalic.  Right Ear: Tympanic membrane normal. Tympanic membrane is not erythematous.  Left Ear: Tympanic membrane normal. Tympanic membrane is not erythematous.  No cerumen impaction no erythema  Eyes:  Conjunctivae are normal.  Neck: Normal range of motion. Neck supple.  Pulmonary/Chest: Effort normal.  Musculoskeletal: Normal range of motion.  No swelling or discoloration. She is tender along the entire right paraspinal area without palpable spasm. No redness or warmth. Use of the extremities.  Neurological: She is alert and oriented to person, place, and time.  Skin: Skin is warm and dry.  Psychiatric: She has a normal mood and affect. Her behavior is normal.  Nursing note and vitals reviewed.   ED Course  Procedures (including critical care time) DIAGNOSTIC STUDIES: Oxygen Saturation is 100% on room air, normal by my interpretation.    COORDINATION OF CARE: 8:34 PM Discussed treatment plan with patient at beside, the patient agrees with the plan and has no further questions at this time.   Labs Review Labs Reviewed - No data to display  Imaging Review No results found.   EKG Interpretation None      MDM   Final diagnoses:  None  1. Bilateral otalgia 2. Muscle strain  NO neurologic deficits with back pain - suspect muscle soreness without underlying injury. She has a referral for further evaluation of ear pain - encouraged her to make an appointment.   I personally performed the services described in this documentation, which was scribed in my presence. The recorded information has been reviewed and is accurate.       Dewaine Oats, PA-C 12/09/14 0973  Serita Grit, MD 12/12/14 [phone]

## 2014-12-02 NOTE — Discharge Instructions (Signed)
Back Pain, Adult Low back pain is very common. About 1 in 5 people have back pain.The cause of low back pain is rarely dangerous. The pain often gets better over time.About half of people with a sudden onset of back pain feel better in just 2 weeks. About 8 in 10 people feel better by 6 weeks.  CAUSES Some common causes of back pain include:  Strain of the muscles or ligaments supporting the spine.  Wear and tear (degeneration) of the spinal discs.  Arthritis.  Direct injury to the back. DIAGNOSIS Most of the time, the direct cause of low back pain is not known.However, back pain can be treated effectively even when the exact cause of the pain is unknown.Answering your caregiver's questions about your overall health and symptoms is one of the most accurate ways to make sure the cause of your pain is not dangerous. If your caregiver needs more information, he or she may order lab work or imaging tests (X-rays or MRIs).However, even if imaging tests show changes in your back, this usually does not require surgery. HOME CARE INSTRUCTIONS For many people, back pain returns.Since low back pain is rarely dangerous, it is often a condition that people can learn to manageon their own.   Remain active. It is stressful on the back to sit or stand in one place. Do not sit, drive, or stand in one place for more than 30 minutes at a time. Take short walks on level surfaces as soon as pain allows.Try to increase the length of time you walk each day.  Do not stay in bed.Resting more than 1 or 2 days can delay your recovery.  Do not avoid exercise or work.Your body is made to move.It is not dangerous to be active, even though your back may hurt.Your back will likely heal faster if you return to being active before your pain is gone.  Pay attention to your body when you bend and lift. Many people have less discomfortwhen lifting if they bend their knees, keep the load close to their bodies,and  avoid twisting. Often, the most comfortable positions are those that put less stress on your recovering back.  Find a comfortable position to sleep. Use a firm mattress and lie on your side with your knees slightly bent. If you lie on your back, put a pillow under your knees.  Only take over-the-counter or prescription medicines as directed by your caregiver. Over-the-counter medicines to reduce pain and inflammation are often the most helpful.Your caregiver may prescribe muscle relaxant drugs.These medicines help dull your pain so you can more quickly return to your normal activities and healthy exercise.  Put ice on the injured area.  Put ice in a plastic bag.  Place a towel between your skin and the bag.  Leave the ice on for 15-20 minutes, 03-04 times a day for the first 2 to 3 days. After that, ice and heat may be alternated to reduce pain and spasms.  Ask your caregiver about trying back exercises and gentle massage. This may be of some benefit.  Avoid feeling anxious or stressed.Stress increases muscle tension and can worsen back pain.It is important to recognize when you are anxious or stressed and learn ways to manage it.Exercise is a great option. SEEK MEDICAL CARE IF:  You have pain that is not relieved with rest or medicine.  You have pain that does not improve in 1 week.  You have new symptoms.  You are generally not feeling well. SEEK   IMMEDIATE MEDICAL CARE IF:   You have pain that radiates from your back into your legs.  You develop new bowel or bladder control problems.  You have unusual weakness or numbness in your arms or legs.  You develop nausea or vomiting.  You develop abdominal pain.  You feel faint. Document Released: 09/22/2005 Document Revised: 03/23/2012 Document Reviewed: 01/24/2014 Exit[employer] Patient Information 2015 [employer]. This information is not intended to replace advice given to you by your health care provider. Make sure you  discuss any questions you have with your health care provider.  

## 2015-03-14 ENCOUNTER — Encounter (HOSPITAL_COMMUNITY): Payer: Self-pay | Admitting: *Deleted

## 2015-03-14 ENCOUNTER — Emergency Department (HOSPITAL_COMMUNITY)
Admission: EM | Admit: 2015-03-14 | Discharge: 2015-03-14 | Disposition: A | Discharge status: Home / Self Care | Payer: Medicare Other | Attending: Emergency Medicine | Admitting: Emergency Medicine

## 2015-03-14 DIAGNOSIS — Z7982 Long term (current) use of aspirin: Secondary | ICD-10-CM | POA: Insufficient documentation

## 2015-03-14 DIAGNOSIS — D649 Anemia, unspecified: Secondary | ICD-10-CM | POA: Diagnosis not present

## 2015-03-14 DIAGNOSIS — Z8673 Personal history of transient ischemic attack (TIA), and cerebral infarction without residual deficits: Secondary | ICD-10-CM | POA: Insufficient documentation

## 2015-03-14 DIAGNOSIS — Z8719 Personal history of other diseases of the digestive system: Secondary | ICD-10-CM | POA: Diagnosis not present

## 2015-03-14 DIAGNOSIS — M79604 Pain in right leg: Secondary | ICD-10-CM | POA: Insufficient documentation

## 2015-03-14 DIAGNOSIS — M79605 Pain in left leg: Secondary | ICD-10-CM | POA: Insufficient documentation

## 2015-03-14 DIAGNOSIS — E876 Hypokalemia: Secondary | ICD-10-CM

## 2015-03-14 DIAGNOSIS — Z79899 Other long term (current) drug therapy: Secondary | ICD-10-CM | POA: Insufficient documentation

## 2015-03-14 DIAGNOSIS — I10 Essential (primary) hypertension: Secondary | ICD-10-CM | POA: Insufficient documentation

## 2015-03-14 DIAGNOSIS — R252 Cramp and spasm: Secondary | ICD-10-CM

## 2015-03-14 LAB — I-STAT CHEM 8, ED
BUN: 5 mg/dL — ABNORMAL LOW (ref 6–20)
CALCIUM ION: 1.2 mmol/L (ref 1.12–1.23)
Chloride: 106 mmol/L (ref 101–111)
Creatinine, Ser: 0.5 mg/dL (ref 0.44–1.00)
GLUCOSE: 84 mg/dL (ref 65–99)
HCT: 28 % — ABNORMAL LOW (ref 36.0–46.0)
Hemoglobin: 9.5 g/dL — ABNORMAL LOW (ref 12.0–15.0)
Potassium: 3.3 mmol/L — ABNORMAL LOW (ref 3.5–5.1)
Sodium: 141 mmol/L (ref 135–145)
TCO2: 20 mmol/L (ref 0–100)

## 2015-03-14 LAB — I-STAT CG4 LACTIC ACID, ED: Lactic Acid, Venous: 0.96 mmol/L (ref 0.5–2.0)

## 2015-03-14 MED ORDER — POTASSIUM CHLORIDE ER 10 MEQ PO TBCR: 20.0000 meq | EXTENDED_RELEASE_TABLET | Freq: Every day | ORAL | Status: DC

## 2015-03-14 NOTE — ED Provider Notes (Signed)
CSN: [id number]     Arrival date & time 03/14/15  1325 History   First MD Initiated Contact with Patient 03/14/15 1614     Chief Complaint  Patient presents with  . Leg Pain     (Consider location/radiation/quality/duration/timing/severity/associated sxs/prior Treatment) Patient is a 49 y.o. female presenting with leg pain.  Leg Pain Location:  Leg Leg location:  L lower leg and R lower leg Pain details:    Quality:  Aching   Radiates to:  Does not radiate   Severity:  Moderate   Onset quality:  Gradual   Duration: 1 year.   Timing:  Constant Chronicity:  Chronic Dislocation: no   Foreign body present:  No foreign bodies Prior injury to area:  No Relieved by:  Nothing Worsened by:  Nothing tried Ineffective treatments:  None tried Associated symptoms: swelling   Associated symptoms: no decreased ROM and no fever   Associated symptoms comment:  Muscle spasm Risk factors: obesity     Past Medical History  Diagnosis Date  . Hypertension   . Stroke   . Cholesterosis   . Anemia    History reviewed. No pertinent past surgical history. Family History  Problem Relation Age of Onset  . Cancer Mother    History  Substance Use Topics  . Smoking status: Never Smoker   . Smokeless tobacco: Never Used  . Alcohol Use: No   OB History    Gravida Para Term Preterm AB TAB SAB Ectopic Multiple Living   3 3 3  0 0 0 0 0 0 3     Review of Systems  Constitutional: Negative for fever.  All other systems reviewed and are negative.     Allergies  Review of patient's allergies indicates no known allergies.  Home Medications   Prior to Admission medications   Medication Sig Start Date End Date Taking? Authorizing Provider  amLODipine (NORVASC) 5 MG tablet Take 5 mg by mouth daily.     Yes Historical Provider, MD  Ascorbic Acid (VITAMIN C) 1000 MG tablet Take 1,000 mg by mouth daily.   Yes Historical Provider, MD  aspirin 81 MG tablet Take 81 mg by mouth daily.   Yes  Historical Provider, MD  cyclobenzaprine (FLEXERIL) 10 MG tablet Take 1 tablet (10 mg total) by mouth 2 (two) times daily as needed for muscle spasms. 12/02/14  Yes Shari Upstill, PA-C  ferrous sulfate 325 (65 FE) MG tablet Take 1 tablet (325 mg total) by mouth daily. 02/10/13  Yes Noemi Chapel, MD  Multiple Vitamin (MULTIVITAMIN WITH MINERALS) TABS tablet Take 1 tablet by mouth daily.   Yes Historical Provider, MD  rivastigmine (EXELON) 9.5 mg/24hr Place 1 patch onto the skin daily.     Yes Historical Provider, MD  simvastatin (ZOCOR) 40 MG tablet Take 40 mg by mouth at bedtime.     Yes Historical Provider, MD  traZODone (DESYREL) 50 MG tablet Take 50 mg by mouth at bedtime as needed. For sleep.    Yes Historical Provider, MD  HYDROcodone-acetaminophen (NORCO/VICODIN) 5-325 MG per tablet Take 1-2 tablets by mouth every 4 (four) hours as needed. Patient not taking: Reported on 03/14/2015 12/02/14   Charlann Lange, PA-C  Norgestimate-Ethinyl Estradiol Triphasic (ORTHO TRI-CYCLEN LO) 0.18/0.215/0.25 MG-25 MCG tab Take 1 tablet by mouth daily. Patient not taking: Reported on 12/02/2014 02/10/13   Noemi Chapel, MD  potassium chloride (K-DUR) 10 MEQ tablet Take 2 tablets (20 mEq total) by mouth daily. 03/14/15 03/21/15  Leo Grosser, MD  BP 142/75 mmHg  Pulse 82  Temp(Src) 98.7 F (37.1 C) (Oral)  Resp 18  SpO2 99% Physical Exam  Constitutional: She is oriented to person, place, and time. She appears well-developed and well-nourished. No distress.  HENT:  Head: Normocephalic.  Eyes: Conjunctivae are normal.  Neck: Neck supple. No tracheal deviation present.  Cardiovascular: Normal rate and regular rhythm.   Pulmonary/Chest: Effort normal. No respiratory distress.  Abdominal: Soft. She exhibits no distension.  Neurological: She is alert and oriented to person, place, and time.  Skin: Skin is warm and dry.  Psychiatric: She has a normal mood and affect.    ED Course  Procedures (including critical  care time) Labs Review Labs Reviewed  I-STAT CHEM 8, ED - Abnormal; Notable for the following:    Potassium 3.3 (*)    BUN 5 (*)    Hemoglobin 9.5 (*)    HCT 28.0 (*)    All other components within normal limits  I-STAT CG4 LACTIC ACID, ED    Imaging Review No results found.   EKG Interpretation None      MDM   Final diagnoses:  Hypokalemia  Cramp of both lower extremities    49 year old female presents with bilateral leg cramping in her lower legs for the last year intermittently mostly at night. She is otherwise well-appearing and was told by her primary care physician's office to come here for evaluation as they were unavailable. She has no signs of objective swelling or difficulty with movement. She does have some stable left-sided weakness from a remote stroke. She is ambulatory at her baseline. She has had no recent surgeries or other risk factors for development of blood clots and this appears to be a long-standing problem. She does have a prior history of anemia and hypokalemia noted on lab review.  Hypokalemia could certainly cause intermittent cramping of patient's calf muscles. She is mildly hypokalemic here and we will supplement her with potassium and recommended follow-up with her primary care physician routinely for a recheck and later date. The patient is not anemic, has had no acute changes in her symptoms, and has no lactic acid elevation to suggest acute overuse.    Leo Grosser, MD 03/14/15 [place], MD 03/15/15 [phone]

## 2015-03-14 NOTE — ED Notes (Signed)
Pt reports pain to bilateral legs for extended amount of time. More severe recently, reports waking up in the  Middle of the night with tightening and muscle spasms to bilateral legs, from groin down to feet, pt feels like she has poor circulation.  Reports difficulty ambulating due to pain. No acute distress noted at triage.

## 2015-03-14 NOTE — Discharge Instructions (Signed)

## 2015-08-10 ENCOUNTER — Ambulatory Visit
Admission: RE | Admit: 2015-08-10 | Discharge: 2015-08-10 | Disposition: A | Discharge status: Home / Self Care | Payer: Medicare Other | Source: Ambulatory Visit | Attending: Family Medicine | Admitting: Family Medicine

## 2015-08-10 ENCOUNTER — Other Ambulatory Visit: Payer: Self-pay | Admitting: Family Medicine

## 2015-08-10 DIAGNOSIS — M542 Cervicalgia: Secondary | ICD-10-CM

## 2015-08-10 DIAGNOSIS — M25511 Pain in right shoulder: Secondary | ICD-10-CM

## 2015-08-10 DIAGNOSIS — M545 Low back pain: Secondary | ICD-10-CM

## 2015-08-15 ENCOUNTER — Other Ambulatory Visit: Payer: Self-pay | Admitting: Family Medicine

## 2015-08-15 DIAGNOSIS — R109 Unspecified abdominal pain: Secondary | ICD-10-CM

## 2015-08-23 ENCOUNTER — Other Ambulatory Visit: Payer: Medicare Other

## 2015-09-04 ENCOUNTER — Inpatient Hospital Stay
Admission: RE | Admit: 2015-09-04 | Discharge: 2015-09-04 | Disposition: A | Discharge status: Home / Self Care | Payer: Medicare Other | Source: Ambulatory Visit | Attending: Family Medicine | Admitting: Family Medicine

## 2016-09-19 ENCOUNTER — Emergency Department (HOSPITAL_COMMUNITY)
Admission: EM | Admit: 2016-09-19 | Discharge: 2016-09-19 | Disposition: A | Discharge status: Home / Self Care | Payer: Medicare Other | Attending: Emergency Medicine | Admitting: Emergency Medicine

## 2016-09-19 ENCOUNTER — Encounter (HOSPITAL_COMMUNITY): Payer: Self-pay | Admitting: Emergency Medicine

## 2016-09-19 ENCOUNTER — Emergency Department (HOSPITAL_COMMUNITY): Payer: Medicare Other

## 2016-09-19 DIAGNOSIS — S8001XA Contusion of right knee, initial encounter: Secondary | ICD-10-CM | POA: Insufficient documentation

## 2016-09-19 DIAGNOSIS — Z7982 Long term (current) use of aspirin: Secondary | ICD-10-CM | POA: Insufficient documentation

## 2016-09-19 DIAGNOSIS — Y939 Activity, unspecified: Secondary | ICD-10-CM | POA: Diagnosis not present

## 2016-09-19 DIAGNOSIS — W010XXA Fall on same level from slipping, tripping and stumbling without subsequent striking against object, initial encounter: Secondary | ICD-10-CM | POA: Diagnosis not present

## 2016-09-19 DIAGNOSIS — Y929 Unspecified place or not applicable: Secondary | ICD-10-CM | POA: Insufficient documentation

## 2016-09-19 DIAGNOSIS — Y999 Unspecified external cause status: Secondary | ICD-10-CM | POA: Insufficient documentation

## 2016-09-19 DIAGNOSIS — Z8673 Personal history of transient ischemic attack (TIA), and cerebral infarction without residual deficits: Secondary | ICD-10-CM | POA: Diagnosis not present

## 2016-09-19 DIAGNOSIS — I10 Essential (primary) hypertension: Secondary | ICD-10-CM | POA: Insufficient documentation

## 2016-09-19 DIAGNOSIS — S8991XA Unspecified injury of right lower leg, initial encounter: Secondary | ICD-10-CM | POA: Diagnosis present

## 2016-09-19 MED ORDER — TETANUS-DIPHTH-ACELL PERTUSSIS 5-2.5-18.5 LF-MCG/0.5 IM SUSP
0.5000 mL | Freq: Once | INTRAMUSCULAR | Status: AC
  Administered 2016-09-19: 0.5 mL via INTRAMUSCULAR
  Filled 2016-09-19: qty 0.5

## 2016-09-19 MED ORDER — IBUPROFEN 800 MG PO TABS: 800.0000 mg | ORAL_TABLET | Freq: Three times a day (TID) | ORAL | 0 refills | Status: DC

## 2016-09-19 MED ORDER — CYCLOBENZAPRINE HCL 10 MG PO TABS: 10.0000 mg | ORAL_TABLET | Freq: Two times a day (BID) | ORAL | 0 refills | Status: DC | PRN

## 2016-09-19 MED ORDER — IBUPROFEN 800 MG PO TABS
800.0000 mg | ORAL_TABLET | Freq: Once | ORAL | Status: AC
  Administered 2016-09-19: 800 mg via ORAL
  Filled 2016-09-19: qty 1

## 2016-09-19 NOTE — Progress Notes (Signed)
[facility] consulted for a walker.  Patient presents to ED this evening with fall injuring right leg.  Patient with history of stroke affecting left side.  [facility] provided patient with walker.  Informed patient that [facility] may be contacting her soon regarding possible copay.  [facility] encouraged patient to ask about platform walker if needed.  Patient verbalized understanding.  No further EDCM needs at this time.

## 2016-09-19 NOTE — ED Triage Notes (Signed)
Pt reports she tripped this evening. Having R posterior knee pain, has not been able to bear weight since fall. Denies any other injury or LOC. Hx of stroke with residual L side weakness which is normal for her.

## 2016-09-19 NOTE — ED Notes (Signed)
Called case manager, Amy for walker.

## 2016-09-19 NOTE — ED Provider Notes (Signed)
[facility] DEPT Provider Note   CSN: [id number] Arrival date & time: 09/19/16  1824  By signing my name below, I, Soijett Blue, attest that this documentation has been prepared under the direction and in the presence of Domenic Moras, PA-C Electronically Signed: Soijett Blue, ED Scribe. 09/19/16. 7:06 PM.  History   Chief Complaint Chief Complaint  Patient presents with  . Fall  . Knee Pain    HPI Cynthia [name] is a 50 y.o. female with a PMHx of HTN, stroke, who presents to the Emergency Department complaining of a fall onset 1 hour ago PTA. Pt notes that she tripped and fell face forward landing directly onto her right knee. Pt notes that her right knee pain is worsened with ambulation and alleviated with rest. Pt denies hearing "clicks" or "pop" to her right knee since the incident occurred. Pt is having associated symptoms of right knee pain, abrasion to right knee, and gait problem due to pain. She notes that she has not tried any medications for the relief of her symptoms. She denies hitting her head, LOC, HA, right hip pain, right ankle pain, and any other symptoms.    The history is provided by the patient. No language interpreter was used.    Past Medical History:  Diagnosis Date  . Anemia   . Cholesterosis   . Hypertension   . Stroke [facility])     Patient Active Problem List   Diagnosis Date Noted  . DUB (dysfunctional uterine bleeding) 11/15/2012  . Fibroids 09/27/2012  . 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 No pertinent surgical history.  OB History    Gravida Para Term Preterm AB Living   3 3 3  0 0 3   SAB TAB Ectopic Multiple Live Births   0 0 0 0         Home Medications    Prior to Admission medications   Medication Sig Start Date End Date Taking? Authorizing Provider  amLODipine (NORVASC) 5 MG tablet Take 5 mg by mouth daily.      Historical Provider, MD  Ascorbic Acid (VITAMIN C) 1000 MG tablet Take 1,000 mg by mouth daily.     Historical Provider, MD  aspirin 81 MG tablet Take 81 mg by mouth daily.    Historical Provider, MD  cyclobenzaprine (FLEXERIL) 10 MG tablet Take 1 tablet (10 mg total) by mouth 2 (two) times daily as needed for muscle spasms. 12/02/14   Charlann Lange, PA-C  ferrous sulfate 325 (65 FE) MG tablet Take 1 tablet (325 mg total) by mouth daily. 02/10/13   Noemi Chapel, MD  HYDROcodone-acetaminophen (NORCO/VICODIN) 5-325 MG per tablet Take 1-2 tablets by mouth every 4 (four) hours as needed. Patient not taking: Reported on 03/14/2015 12/02/14   Charlann Lange, PA-C  Multiple Vitamin (MULTIVITAMIN WITH MINERALS) TABS tablet Take 1 tablet by mouth daily.    Historical Provider, MD  Norgestimate-Ethinyl Estradiol Triphasic (ORTHO TRI-CYCLEN LO) 0.18/0.215/0.25 MG-25 MCG tab Take 1 tablet by mouth daily. Patient not taking: Reported on 12/02/2014 02/10/13   Noemi Chapel, MD  potassium chloride (K-DUR) 10 MEQ tablet Take 2 tablets (20 mEq total) by mouth daily. 03/14/15 03/21/15  Leo Grosser, MD  rivastigmine (EXELON) 9.5 mg/24hr Place 1 patch onto the skin daily.      Historical Provider, MD  simvastatin (ZOCOR) 40 MG tablet Take 40 mg by mouth at bedtime.      Historical Provider, MD  traZODone (DESYREL) 50 MG tablet Take 50 mg by  mouth at bedtime as needed. For sleep.     Historical Provider, MD    Family History Family History  Problem Relation Age of Onset  . Cancer Mother     Social History Social History  Substance Use Topics  . Smoking status: Never Smoker  . Smokeless tobacco: Never Used  . Alcohol use No     Allergies   Patient has no known allergies.   Review of Systems Review of Systems  Musculoskeletal: Positive for arthralgias (right knee) and gait problem (due to pain). Negative for joint swelling.  Skin: Positive for wound (abrasion to right knee). Negative for color change.  Neurological: Negative for syncope and headaches.     Physical Exam Updated Vital Signs BP 143/81    Pulse 88   Temp 98 F (36.7 C) (Oral)   Resp 16   SpO2 98%   Physical Exam  Constitutional: She is oriented to person, place, and time. She appears well-developed and well-nourished. No distress.  HENT:  Head: Normocephalic and atraumatic.  Eyes: EOM are normal.  Neck: Neck supple.  Cardiovascular: Normal rate.   Pulmonary/Chest: Effort normal. No respiratory distress.  Abdominal: She exhibits no distension.  Musculoskeletal: Normal range of motion.       Right knee: Tenderness found.  Right knee: Abrasion noted to anterior knee and patella with associated tenderness. Patellar is located. Negative anterior and posterior drawers test. Tenderness to right knee with varus and valgus maneuver.   Neurological: She is alert and oriented to person, place, and time.  Skin: Skin is warm and dry.  Psychiatric: She has a normal mood and affect. Her behavior is normal.  Nursing note and vitals reviewed.    ED Treatments / Results  DIAGNOSTIC STUDIES: Oxygen Saturation is 98% on RA, nl by my interpretation.    COORDINATION OF CARE: 6:55 PM Discussed treatment plan with pt at bedside which includes right knee xray, ibuprofen Rx, flexeril Rx, and referral and follow up with orthopedist, and pt agreed to plan.   Radiology Dg Knee Complete 4 Views Right  Result Date: 09/19/2016 CLINICAL DATA:  Fall today while walking.  Anterior knee pain. EXAM: RIGHT KNEE - COMPLETE 4+ VIEW COMPARISON:  None. FINDINGS: No evidence of fracture, dislocation, or joint effusion. No evidence of arthropathy or other focal bone abnormality. Soft tissues are unremarkable. IMPRESSION: Negative. Electronically Signed   By: Rolm Baptise M.D.   On: 09/19/2016 19:00    Procedures Procedures (including critical care time)  Medications Ordered in ED Medications - No data to display   Initial Impression / Assessment and Plan / ED Course  I have reviewed the triage vital signs and the nursing notes.  Pertinent  imaging results that were available during my care of the patient were reviewed by me and considered in my medical decision making (see chart for details).  Clinical Course    BP 143/81   Pulse 88   Temp 98 F (36.7 C) (Oral)   Resp 16   SpO2 98%   Patient X-Ray negative for obvious fracture or dislocation.  Pt advised to follow up with orthopedics. Patient given knee sleeve and crutches while in ED. Pt will be discharged home with ibuprofen and flexeril Rx. Conservative therapy recommended and discussed. Patient will be discharged home & is agreeable with above plan. Returns precautions discussed. Pt appears safe for discharge.  7:46 PM Pt unable to use crutches due to L side weakness from prior stroke.  She may need a  walker to help with mobility.    Final Clinical Impressions(s) / ED Diagnoses   Final diagnoses:  Contusion of right knee, initial encounter    New Prescriptions New Prescriptions   IBUPROFEN (ADVIL,MOTRIN) 800 MG TABLET    Take 1 tablet (800 mg total) by mouth 3 (three) times daily.   I personally performed the services described in this documentation, which was scribed in my presence. The recorded information has been reviewed and is accurate.      Domenic Moras, PA-C 09/19/16 1910    Domenic Moras, PA-C 09/19/16 [place], MD 09/19/16 2252

## 2016-11-30 IMAGING — CR DG THORACIC SPINE 3V
3 series · 3 of 3 positions shown · non-contrast
Comparison: CT scan of the chest August 29, 2011 and PA and
lateral chest x-ray January 29, 2011.

CLINICAL DATA: Chronic right-sided thoracic pain without known
injury, history of left-sided paralysis.

EXAM:
THORACIC SPINE - 3 VIEWS

[t t-spine a.p.]
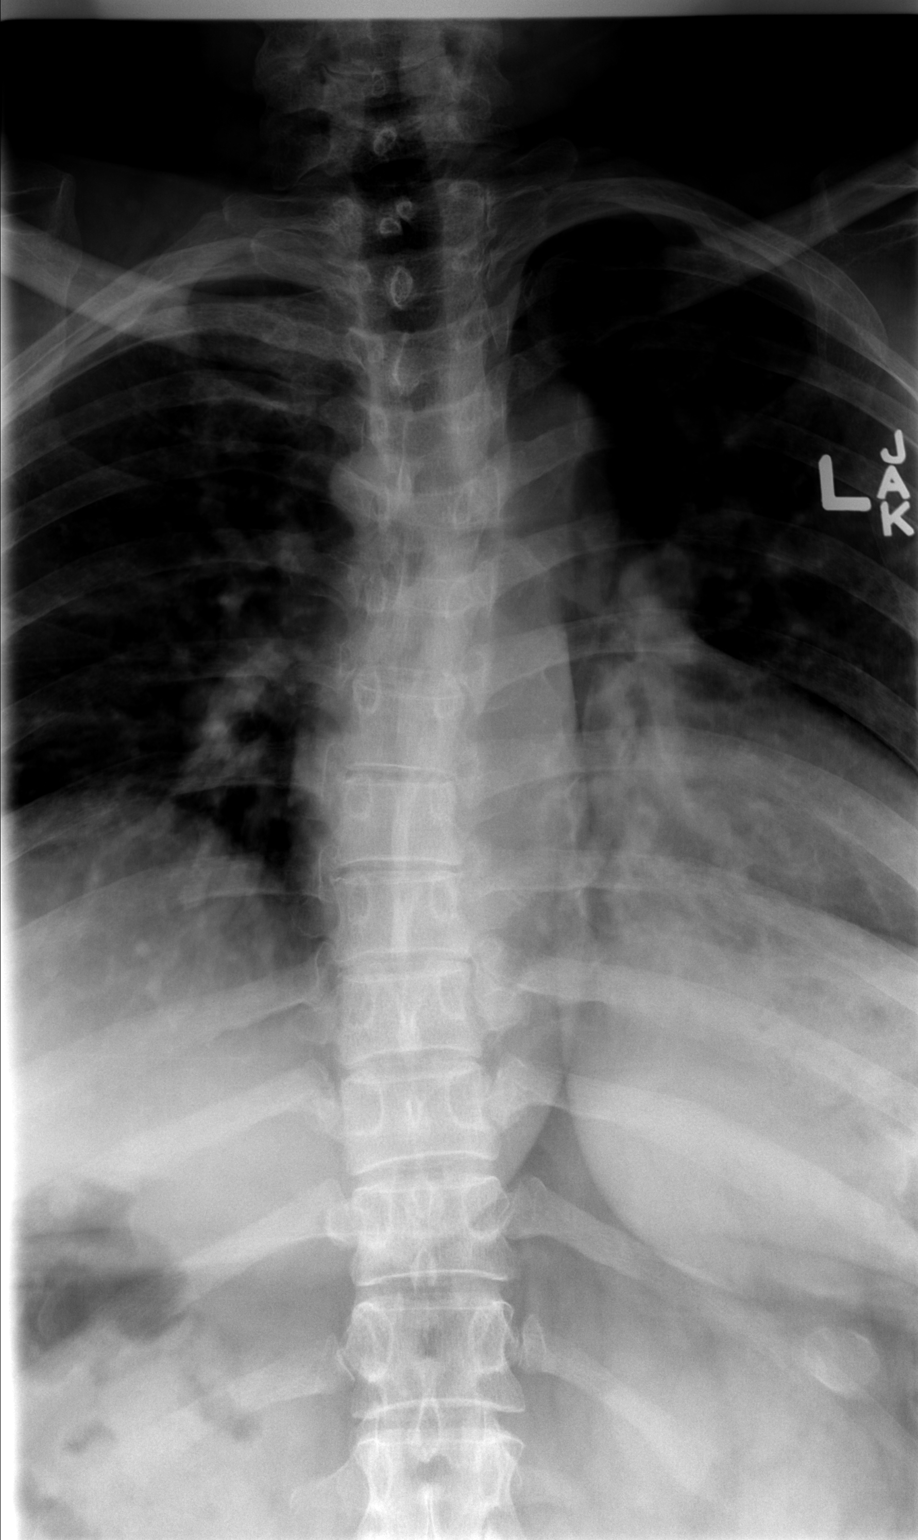

[t t-spine lat]
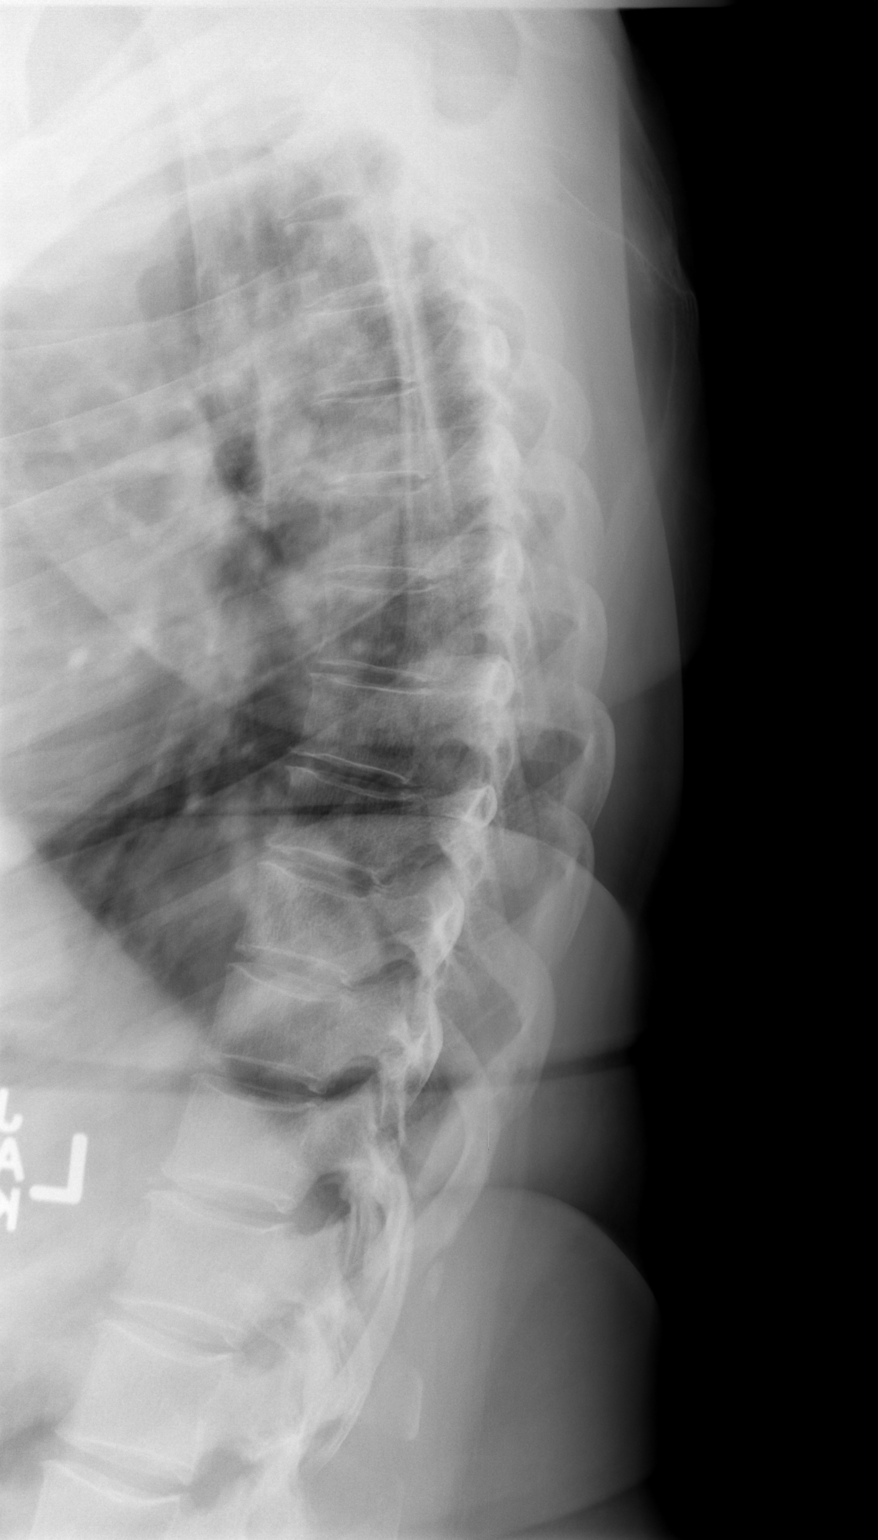

[t swimmers]
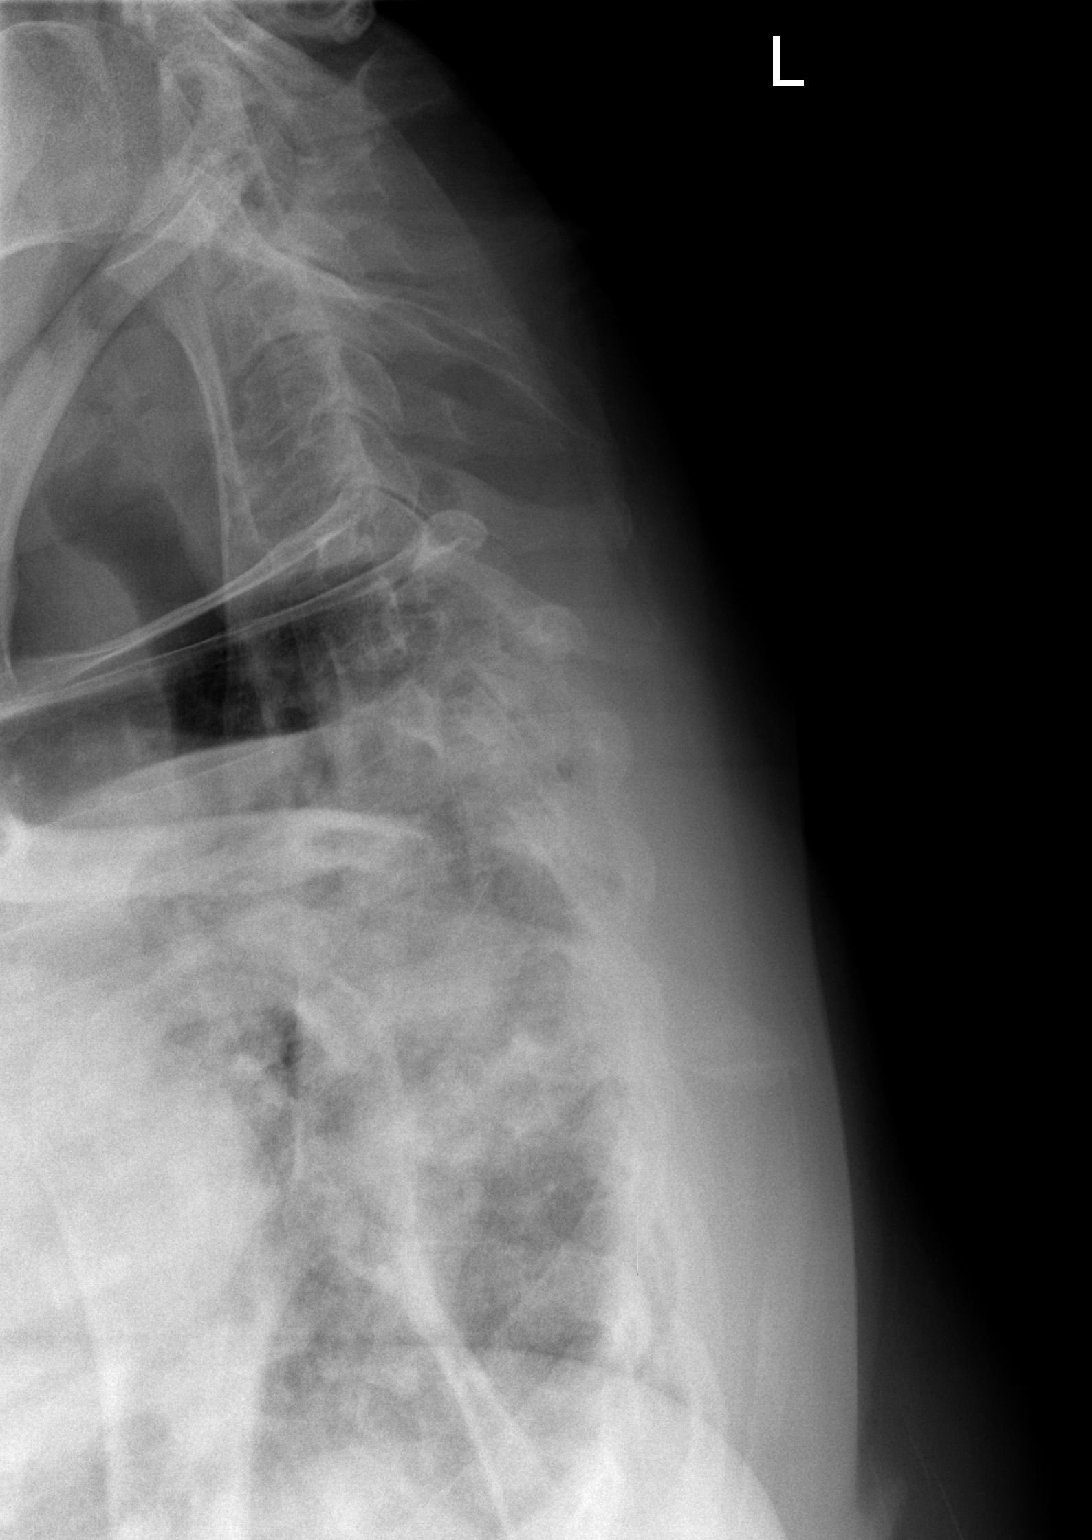

[3 of 3 positions shown; findings below may reference images not displayed]

FINDINGS: There is gentle dextrocurvature of the lower thoracic spine. The
pedicles are intact. There are no abnormal paravertebral soft tissue
densities. The intervertebral disc space heights are well
maintained.

There is a rim calcified nodule in the left upper quadrant of the
abdomen. Please see the discussion regarding this finding on the
lumbar spine series of today's date.
IMPRESSION: Chronic mild dextrocurvature centered in the lower thoracic spine.
There is no acute bony abnormality nor significant degenerative
change.

## 2016-11-30 IMAGING — CR DG SHOULDER 2+V*R*
2 series · 2 of 2 positions shown · non-contrast
Comparison: Chest x-ray of August 29, 2011 which included a
portion of the right shoulder.

CLINICAL DATA: Right shoulder pain with decreased range of motion,
no known injury

EXAM:
RIGHT SHOULDER - 2+ VIEW

[w shoulder ap internal righ]
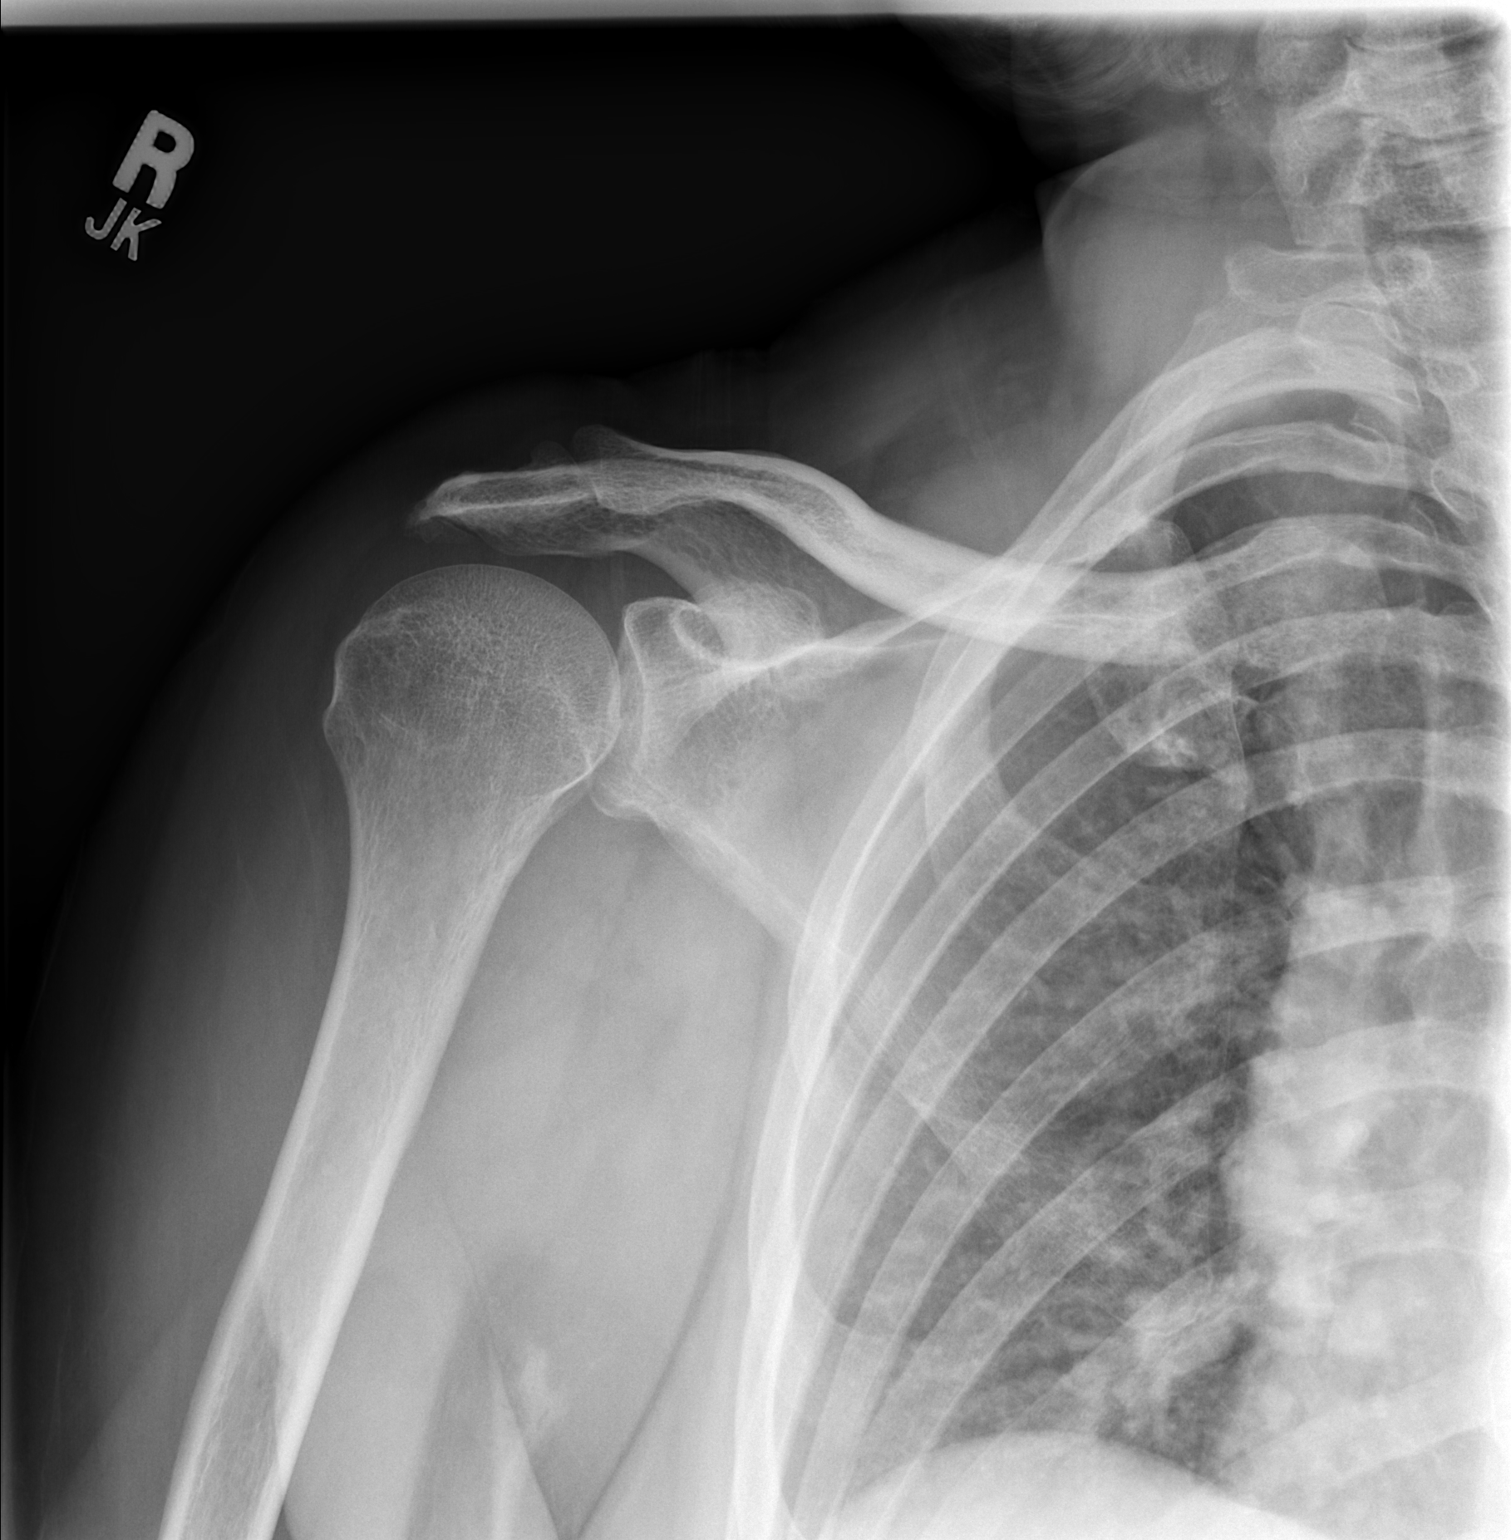

[w shoulder ap external righ]
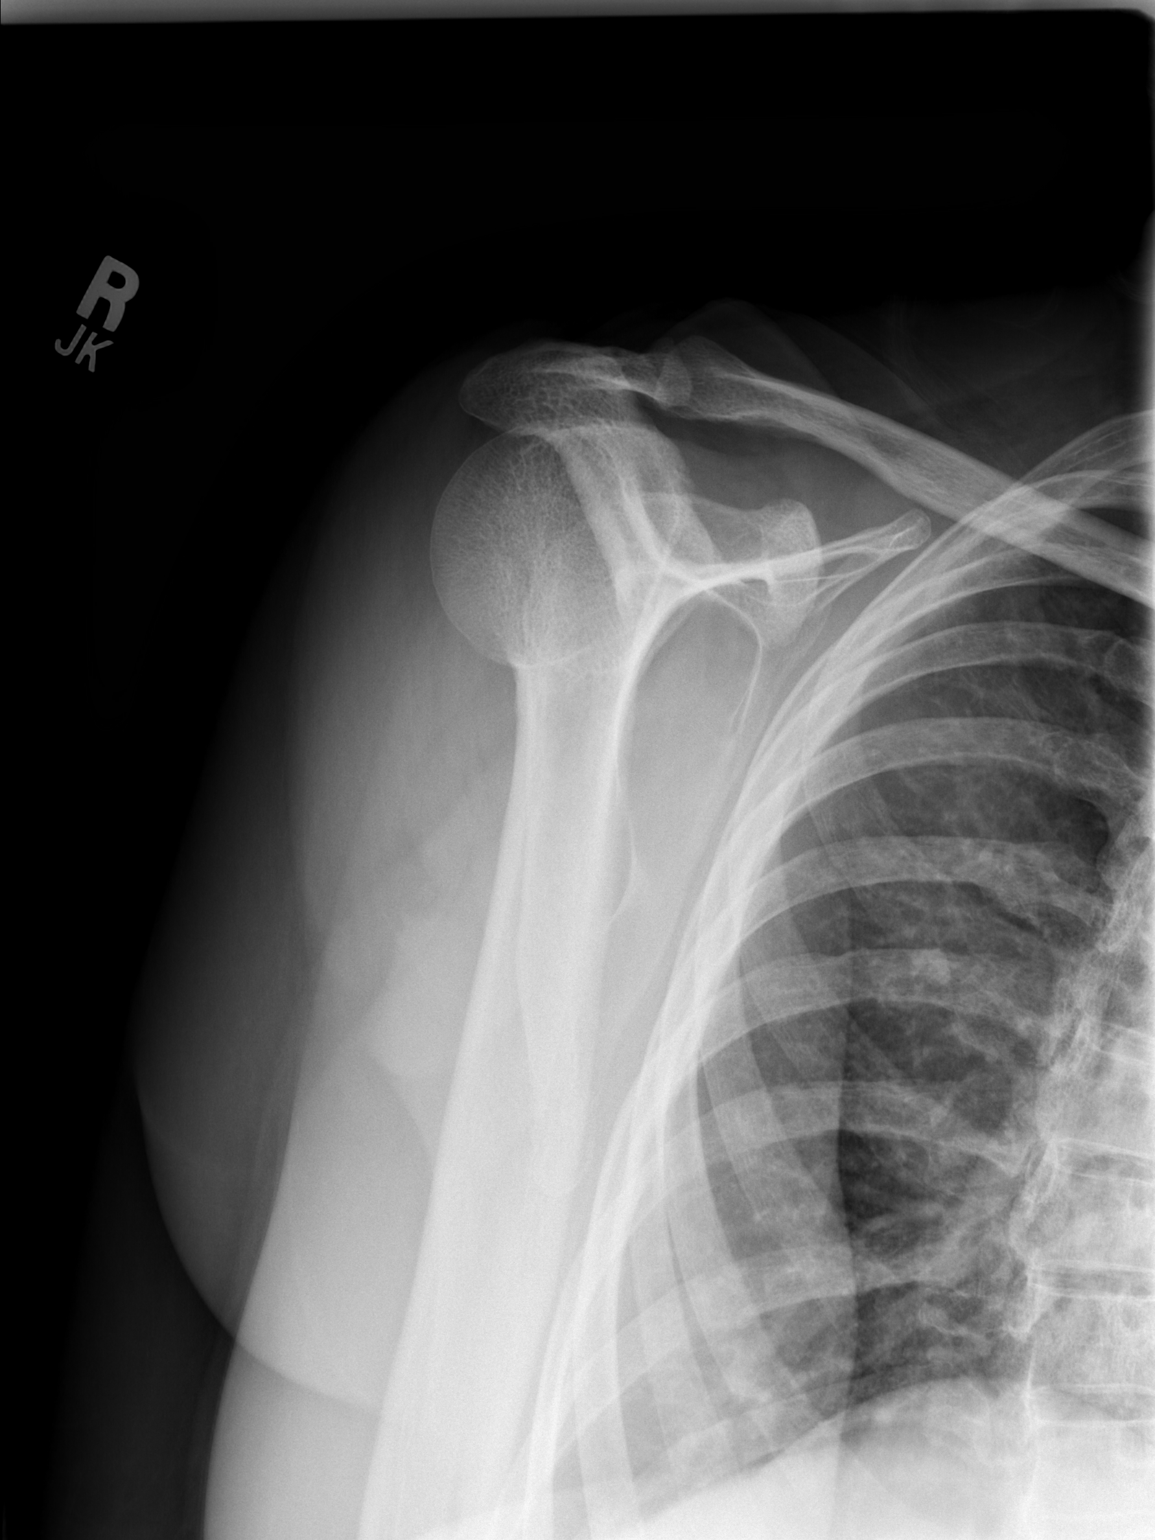

[2 of 2 positions shown; findings below may reference images not displayed]

FINDINGS: The glenohumeral joint is normal in appearance. There is a small
subacromial spur. The AC joint exhibits mild narrowing. There is no
acute fracture nor dislocation. The observed portions of the upper
right ribs are normal.
IMPRESSION: There is a small subacromial spur. There is no acute or significant
chronic bony abnormality otherwise.

## 2017-10-10 ENCOUNTER — Encounter (HOSPITAL_COMMUNITY): Payer: Self-pay | Admitting: Emergency Medicine

## 2017-10-10 ENCOUNTER — Ambulatory Visit (HOSPITAL_COMMUNITY)
Admission: EM | Admit: 2017-10-10 | Discharge: 2017-10-10 | Disposition: A | Discharge status: Home / Self Care | Payer: Self-pay | Attending: Family Medicine | Admitting: Family Medicine

## 2017-10-10 ENCOUNTER — Ambulatory Visit (INDEPENDENT_AMBULATORY_CARE_PROVIDER_SITE_OTHER): Payer: Self-pay

## 2017-10-10 DIAGNOSIS — S161XXA Strain of muscle, fascia and tendon at neck level, initial encounter: Secondary | ICD-10-CM

## 2017-10-10 MED ORDER — CYCLOBENZAPRINE HCL 10 MG PO TABS: 10.0000 mg | ORAL_TABLET | Freq: Three times a day (TID) | ORAL | 0 refills | Status: DC

## 2017-10-10 MED ORDER — DICLOFENAC SODIUM 75 MG PO TBEC: 75.0000 mg | DELAYED_RELEASE_TABLET | Freq: Two times a day (BID) | ORAL | 0 refills | Status: DC

## 2017-10-10 NOTE — Discharge Instructions (Addendum)
Please follow up with your doctor in one week if not showing significant improvement.
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
## 2018-12-09 ENCOUNTER — Other Ambulatory Visit: Payer: Self-pay | Admitting: Family Medicine

## 2018-12-09 DIAGNOSIS — Z1231 Encounter for screening mammogram for malignant neoplasm of breast: Secondary | ICD-10-CM

## 2019-01-31 ENCOUNTER — Other Ambulatory Visit: Payer: Self-pay | Admitting: Family Medicine

## 2019-01-31 DIAGNOSIS — N63 Unspecified lump in unspecified breast: Secondary | ICD-10-CM

## 2019-03-09 ENCOUNTER — Ambulatory Visit
Admission: RE | Admit: 2019-03-09 | Discharge: 2019-03-09 | Disposition: A | Discharge status: Home / Self Care | Payer: Medicare HMO | Source: Ambulatory Visit | Attending: Family Medicine | Admitting: Family Medicine

## 2019-03-09 ENCOUNTER — Other Ambulatory Visit: Payer: Self-pay

## 2019-03-09 DIAGNOSIS — N63 Unspecified lump in unspecified breast: Secondary | ICD-10-CM

## 2019-12-27 DIAGNOSIS — Z8616 Personal history of COVID-19: Secondary | ICD-10-CM

## 2019-12-27 HISTORY — DX: Personal history of COVID-19: Z86.16

## 2019-12-28 ENCOUNTER — Other Ambulatory Visit: Payer: Self-pay

## 2019-12-28 ENCOUNTER — Encounter (HOSPITAL_COMMUNITY): Payer: Self-pay | Admitting: Emergency Medicine

## 2019-12-28 ENCOUNTER — Emergency Department (HOSPITAL_COMMUNITY): Payer: Medicare HMO

## 2019-12-28 ENCOUNTER — Emergency Department (HOSPITAL_COMMUNITY)
Admission: EM | Admit: 2019-12-28 | Discharge: 2019-12-28 | Disposition: A | Discharge status: Home / Self Care | Payer: Medicare HMO | Attending: Emergency Medicine | Admitting: Emergency Medicine

## 2019-12-28 DIAGNOSIS — R0602 Shortness of breath: Secondary | ICD-10-CM | POA: Diagnosis present

## 2019-12-28 DIAGNOSIS — Z5321 Procedure and treatment not carried out due to patient leaving prior to being seen by health care provider: Secondary | ICD-10-CM | POA: Insufficient documentation

## 2019-12-28 LAB — CBC
HCT: 38.5 % (ref 36.0–46.0)
Hemoglobin: 11.9 g/dL — ABNORMAL LOW (ref 12.0–15.0)
MCH: 27.5 pg (ref 26.0–34.0)
MCHC: 30.9 g/dL (ref 30.0–36.0)
MCV: 89.1 fL (ref 80.0–100.0)
Platelets: 279 10*3/uL (ref 150–400)
RBC: 4.32 MIL/uL (ref 3.87–5.11)
RDW: 14.3 % (ref 11.5–15.5)
WBC: 3.6 10*3/uL — ABNORMAL LOW (ref 4.0–10.5)
nRBC: 0 % (ref 0.0–0.2)

## 2019-12-28 LAB — BASIC METABOLIC PANEL
Anion gap: 10 (ref 5–15)
BUN: 5 mg/dL — ABNORMAL LOW (ref 6–20)
CO2: 24 mmol/L (ref 22–32)
Calcium: 8.6 mg/dL — ABNORMAL LOW (ref 8.9–10.3)
Chloride: 104 mmol/L (ref 98–111)
Creatinine, Ser: 0.71 mg/dL (ref 0.44–1.00)
GFR calc Af Amer: >60 mL/min (ref >60)
GFR calc non Af Amer: >60 mL/min (ref >60)
Glucose, Bld: 118 mg/dL — ABNORMAL HIGH (ref 70–99)
Potassium: 3.3 mmol/L — ABNORMAL LOW (ref 3.5–5.1)
Sodium: 138 mmol/L (ref 135–145)

## 2019-12-28 NOTE — ED Notes (Signed)
Pt called for a room, no answer ?

## 2019-12-28 NOTE — ED Notes (Signed)
No answer x3

## 2019-12-28 NOTE — ED Triage Notes (Signed)
Pt reports being diagnosed with COVID yesterday, has had S/S X1 week. Pt reports feeling SOB. Pt in NAD.

## 2020-03-23 ENCOUNTER — Ambulatory Visit
Admission: EM | Admit: 2020-03-23 | Discharge: 2020-03-23 | Disposition: A | Discharge status: Home / Self Care | Payer: Medicare HMO

## 2020-03-23 ENCOUNTER — Ambulatory Visit (INDEPENDENT_AMBULATORY_CARE_PROVIDER_SITE_OTHER): Payer: Medicare HMO

## 2020-03-23 ENCOUNTER — Other Ambulatory Visit: Payer: Self-pay

## 2020-03-23 DIAGNOSIS — J209 Acute bronchitis, unspecified: Secondary | ICD-10-CM

## 2020-03-23 DIAGNOSIS — U071 COVID-19: Secondary | ICD-10-CM | POA: Diagnosis not present

## 2020-03-23 MED ORDER — ALBUTEROL SULFATE HFA 108 (90 BASE) MCG/ACT IN AERS: 2.0000 | INHALATION_SPRAY | RESPIRATORY_TRACT | 0 refills | Status: AC | PRN

## 2020-03-23 MED ORDER — AEROCHAMBER PLUS FLO-VU MEDIUM MISC: 1.0000 | Freq: Once | 0 refills | Status: AC

## 2020-03-23 MED ORDER — CETIRIZINE HCL 10 MG PO TABS
10.0000 mg | ORAL_TABLET | Freq: Every day | ORAL | 0 refills | Status: AC
Start: 2020-03-23 — End: ?

## 2020-03-23 MED ORDER — BENZONATATE 100 MG PO CAPS: 100.0000 mg | ORAL_CAPSULE | Freq: Three times a day (TID) | ORAL | 0 refills | Status: AC

## 2020-03-23 MED ORDER — PREDNISONE 20 MG PO TABS: 40.0000 mg | ORAL_TABLET | Freq: Every day | ORAL | 0 refills | Status: AC

## 2020-03-23 MED ORDER — FLUTICASONE PROPIONATE 50 MCG/ACT NA SUSP: 1.0000 | Freq: Every day | NASAL | 0 refills | Status: AC

## 2020-03-23 NOTE — ED Triage Notes (Signed)
Pt c/o productive cough for approx 3 weeks with yellow sputum initially, now with white sputum. Also reports SOB, nausea, weakness, lightheadedness to the point pt states "I almost passed out yesterday."  Pt reports COVID positive at end of March-tested at independent pharmacy.  Denies fever, chills, v/d, body aches.  Has been taking tylenol, benadryl, cough/cold OTC remedies with mild improvement.  Harsh cough noted. Pt mildly tachypneic (RR 24)  after ambulation to room; then RR return to normal parameters 20. Lung sounds slightly diminished at bases

## 2020-03-23 NOTE — ED Provider Notes (Signed)
[facility]    CSN: [id number] Arrival date & time: 03/23/20  1613      History   Chief Complaint Chief Complaint  Patient presents with  . Cough    HPI Cynthia [name] is a 54 y.o. female with history of hypertension, stroke presenting for 3-week course of productive cough.  States sputum is initially yellow, now white.  No hemoptysis, chest pain.  Noting increased dyspnea from baseline.  States she got lightheaded yesterday upon standing.  Denies lower extremity edema, fever, dizziness, change in vision.  Has taken Tylenol, Benadryl, OTC cough medications with some improvement.  Denies history of heart failure, asthma, COPD.  Did have Covid end of March: Recovered fully prior to symptom onset.  No known sick contacts.   Past Medical History:  Diagnosis Date  . Anemia   . Cholesterosis   . Hypertension   . Stroke [facility])     Patient Active Problem List   Diagnosis Date Noted  . DUB (dysfunctional uterine bleeding) 11/15/2012  . Fibroids 09/27/2012  . 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 No pertinent surgical history.  OB History    Gravida  3   Para  3   Term  3   Preterm  0   AB  0   Living  3     SAB  0   TAB  0   Ectopic  0   Multiple  0   Live Births               Home Medications    Prior to Admission medications   Medication Sig Start Date End Date Taking? Authorizing Provider  amLODipine (NORVASC) 5 MG tablet Take 5 mg by mouth daily.     Yes [provider]  aspirin 81 MG tablet Take 81 mg by mouth daily.   Yes [provider]  lisinopril (ZESTRIL) 20 MG tablet lisinopril 20 mg tablet  TAKE 1 TABLET BY MOUTH ONCE DAILY 08/08/15  Yes [provider]  simvastatin (ZOCOR) 40 MG tablet Take 40 mg by mouth at bedtime.     Yes [provider]  albuterol (VENTOLIN HFA) 108 (90 Base) MCG/ACT inhaler Inhale 2 puffs into the lungs every 4 (four) hours as needed for wheezing or  shortness of breath. 03/23/20   Hall-Potvin, [place], PA-C  ALPRAZolam (XANAX) 0.5 MG tablet alprazolam 0.5 mg tablet  Take 1 tablet by oral route.    [provider]  Ascorbic Acid (VITAMIN C) 1000 MG tablet Take 1,000 mg by mouth daily.    [provider]  benzonatate (TESSALON) 100 MG capsule Take 1 capsule (100 mg total) by mouth every 8 (eight) hours. 03/23/20   Hall-Potvin, [place], PA-C  cetirizine (ZYRTEC ALLERGY) 10 MG tablet Take 1 tablet (10 mg total) by mouth daily. 03/23/20   Hall-Potvin, [place], PA-C  cyclobenzaprine (FLEXERIL) 10 MG tablet Take 1 tablet (10 mg total) by mouth 3 (three) times daily. 10/10/17   Vanessa Kick, MD  diclofenac (VOLTAREN) 75 MG EC tablet Take 1 tablet (75 mg total) by mouth 2 (two) times daily. 10/10/17   Vanessa Kick, MD  ferrous sulfate 325 (65 FE) MG tablet Take 1 tablet (325 mg total) by mouth daily. 02/10/13   Noemi Chapel, MD  fluticasone (FLONASE) 50 MCG/ACT nasal spray Place 1 spray into both nostrils daily. 03/23/20   Hall-Potvin, [place], PA-C  HYDROcodone-acetaminophen (NORCO/VICODIN) 5-325 MG per tablet Take 1-2 tablets by mouth every 4 (  four) hours as needed. 12/02/14   Charlann Lange, PA-C  ibuprofen (ADVIL,MOTRIN) 800 MG tablet Take 1 tablet (800 mg total) by mouth 3 (three) times daily. 09/19/16   Domenic Moras, PA-C  Multiple Vitamin (MULTIVITAMIN WITH MINERALS) TABS tablet Take 1 tablet by mouth daily.    [provider]  Norgestimate-Ethinyl Estradiol Triphasic (ORTHO TRI-CYCLEN LO) 0.18/0.215/0.25 MG-25 MCG tab Take 1 tablet by mouth daily. Patient not taking: Reported on 12/02/2014 02/10/13   Noemi Chapel, MD  potassium chloride (K-DUR) 10 MEQ tablet Take 2 tablets (20 mEq total) by mouth daily. 03/14/15 03/21/15  Leo Grosser, MD  predniSONE (DELTASONE) 20 MG tablet Take 2 tablets (40 mg total) by mouth daily for 5 days. 03/23/20 03/28/20  Hall-Potvin, [place], PA-C  rivastigmine (EXELON) 9.5 mg/24hr Place 1 patch onto  the skin daily.      [provider]  Spacer/Aero-Holding Chambers (AEROCHAMBER PLUS FLO-VU MEDIUM) MISC 1 each by Other route once for 1 dose. 03/23/20 03/23/20  Hall-Potvin, [place], PA-C  traZODone (DESYREL) 50 MG tablet Take 50 mg by mouth at bedtime as needed. For sleep.     [provider]    Family History Family History  Problem Relation Age of Onset  . Cancer Mother     Social History Social History   Tobacco Use  . Smoking status: Never Smoker  . Smokeless tobacco: Never Used  Substance Use Topics  . Alcohol use: No  . Drug use: No     Allergies   Patient has no known allergies.   Review of Systems As per HPI   Physical Exam Triage Vital Signs ED Triage Vitals  Enc Vitals Group     BP 03/23/20 1628 (!) 154/105     Pulse Rate 03/23/20 1628 95     Resp 03/23/20 1628 20     Temp 03/23/20 1628 98.8 F (37.1 C)     Temp Source 03/23/20 1628 Oral     SpO2 03/23/20 1628 97 %     Weight --      Height --      Head Circumference --      Peak Flow --      Pain Score 03/23/20 1630 7     Pain Loc --      Pain Edu? --      Excl. in [place]? --    No data found.  Updated Vital Signs BP (!) 154/105 (BP Location: Right Wrist)   Pulse 95   Temp 98.8 F (37.1 C) (Oral)   Resp 20   SpO2 97%   Visual Acuity Right Eye Distance:   Left Eye Distance:   Bilateral Distance:    Right Eye Near:   Left Eye Near:    Bilateral Near:     Physical Exam Constitutional:      General: She is not in acute distress.    Appearance: She is obese. She is not ill-appearing or diaphoretic.  HENT:     Head: Normocephalic and atraumatic.     Mouth/Throat:     Mouth: Mucous membranes are moist.     Pharynx: Oropharynx is clear. No oropharyngeal exudate or posterior oropharyngeal erythema.  Eyes:     General: No scleral icterus.    Conjunctiva/sclera: Conjunctivae normal.     Pupils: Pupils are equal, round, and reactive to light.  Neck:     Comments:  Trachea midline, negative JVD Cardiovascular:     Rate and Rhythm: Normal rate and regular rhythm.  Heart sounds: No murmur heard.  No gallop.   Pulmonary:     Effort: Pulmonary effort is normal. No respiratory distress.     Breath sounds: No wheezing, rhonchi or rales.     Comments: Decreased breath sounds at bases Musculoskeletal:     Cervical back: Neck supple. No tenderness.  Lymphadenopathy:     Cervical: No cervical adenopathy.  Skin:    Capillary Refill: Capillary refill takes less than 2 seconds.     Coloration: Skin is not jaundiced or pale.     Findings: No rash.  Neurological:     General: No focal deficit present.     Mental Status: She is alert and oriented to person, place, and time.      UC Treatments / Results  Labs (all labs ordered are listed, but only abnormal results are displayed) Labs Reviewed - No data to display  EKG   Radiology DG Chest 2 View  Result Date: 03/23/2020 CLINICAL DATA:  Productive cough for 3 weeks. Recent COVID-19 virus infection. EXAM: CHEST - 2 VIEW COMPARISON:  08/29/2011 FINDINGS: The heart size and mediastinal contours are within normal limits. Both lungs are clear. The visualized skeletal structures are unremarkable. IMPRESSION: No active cardiopulmonary disease. Electronically Signed   By: Marlaine Hind M.D.   On: 03/23/2020 17:01    Procedures Procedures (including critical care time)  Medications Ordered in UC Medications - No data to display  Initial Impression / Assessment and Plan / UC Course  I have reviewed the triage vital signs and the nursing notes.  Pertinent labs & imaging results that were available during my care of the patient were reviewed by me and considered in my medical decision making (see chart for details).     Patient afebrile, nontoxic in office today.  Patient does have elevated BP reading: Denies headache, chest pain, severe abdominal pain.  Chest x-ray done office, reviewed by me radiology:  Negative for active cardiopulmonary disease.  We will treat for bronchitis as outlined below.  Return precautions discussed, patient verbalized understanding and is agreeable to plan. Final Clinical Impressions(s) / UC Diagnoses   Final diagnoses:  Acute bronchitis, unspecified organism     Discharge Instructions     Tessalon for cough. Start flonase, atrovent nasal spray for nasal congestion/drainage. You can use over the counter nasal saline rinse such as neti pot for nasal congestion. Keep hydrated, your urine should be clear to pale yellow in color. Tylenol/motrin for fever and pain. Monitor for any worsening of symptoms, chest pain, shortness of breath, wheezing, swelling of the throat, go to the emergency department for further evaluation needed.   YouTube how to use spacer with inhaler.    ED Prescriptions    Medication Sig Dispense Auth. Provider   benzonatate (TESSALON) 100 MG capsule Take 1 capsule (100 mg total) by mouth every 8 (eight) hours. 21 capsule Hall-Potvin, [place], PA-C   cetirizine (ZYRTEC ALLERGY) 10 MG tablet Take 1 tablet (10 mg total) by mouth daily. 30 tablet Hall-Potvin, [place], PA-C   fluticasone (FLONASE) 50 MCG/ACT nasal spray Place 1 spray into both nostrils daily. 16 g Hall-Potvin, [place], PA-C   predniSONE (DELTASONE) 20 MG tablet Take 2 tablets (40 mg total) by mouth daily for 5 days. 10 tablet Hall-Potvin, [place], PA-C   albuterol (VENTOLIN HFA) 108 (90 Base) MCG/ACT inhaler Inhale 2 puffs into the lungs every 4 (four) hours as needed for wheezing or shortness of breath. 18 g Hall-Potvin, [place], PA-C   Spacer/Aero-Holding  Chambers (AEROCHAMBER PLUS FLO-VU MEDIUM) MISC 1 each by Other route once for 1 dose. 1 each Hall-Potvin, [place], PA-C     PDMP not reviewed this encounter.   Hall-Potvin, [place], [place] 03/23/20 1822

## 2020-03-23 NOTE — Discharge Instructions (Addendum)
Tessalon for cough. Start flonase, atrovent nasal spray for nasal congestion/drainage. You can use over the counter nasal saline rinse such as neti pot for nasal congestion. Keep hydrated, your urine should be clear to pale yellow in color. Tylenol/motrin for fever and pain. Monitor for any worsening of symptoms, chest pain, shortness of breath, wheezing, swelling of the throat, go to the emergency department for further evaluation needed.   YouTube how to use spacer with inhaler.

## 2020-07-11 ENCOUNTER — Encounter (HOSPITAL_BASED_OUTPATIENT_CLINIC_OR_DEPARTMENT_OTHER): Payer: Self-pay | Admitting: Obstetrics and Gynecology

## 2020-07-11 NOTE — H&P (Signed)
Cynthia [name] is an 54 y.o. female.  [id number] presenting for scheduled surgery. She had irregular bleeding and was found to have labs consistent with menopause. EMB done and was benign. SIS performed and was noteable for a 9.5cm uterus with a 3.6cm intramural fibroid, a 2cm submucosal fibroid, and a 2.4cm polyp.   Pertinent Gynecological History: Menses: flow is excessive with use of 12 pads or tampons on heaviest days Bleeding: dysfunctional uterine bleeding Contraception: tubal ligation DES exposure: denies Blood transfusions: none Sexually transmitted diseases: no past history Previous GYN Procedures: n/a  Last mammogram: abnormal: 2021 and has follow up Last pap: normal Date: 2021 OB History: G4, P3013   Menstrual History: No LMP recorded. (Menstrual status: Perimenopausal).    Past Medical History:  Diagnosis Date   Anemia    Cholesterosis    HLD (hyperlipidemia)    Hypertension    Obesity    Stroke [facility])    2012 left sided residual weakness    Past Surgical History:  Procedure Laterality Date   TUBAL LIGATION      Family History  Problem Relation Age of Onset   Cancer Mother     Social History:  reports that she has never smoked. She has never used smokeless tobacco. She reports that she does not drink alcohol and does not use drugs.  Allergies: No Known Allergies  No medications prior to admission.    Review of Systems  There were no vitals taken for this visit. Physical Exam  Gen: well appearing, NAD CV: Reg rate Pulm: NWOB Abd: soft, nondistended, nontender, no masses but limited by obesity GYN: uterus 8 week size, no adnexa ttp/CMT Ext: No edema b/l   No results found for this or any previous visit (from the past 24 hour(s)).  No results found.  Assessment/Plan: 54 yo presenting for scheduled hysteroscopy, dilation and curretage, myomectomy, and polypectomy. Risks discussed including infection, bleeding, damage to surrounding  structures, fluid overload, not being able to remove entire polyp/fibroid, need for additional procedures, postoperative DVT and unexpected pathology. All questions answered. Consent signed in office. She has stopped aspirin 7 days prior to surgery.    Tyson Dense 07/11/2020, 12:58 PM

## 2020-07-24 ENCOUNTER — Encounter (HOSPITAL_BASED_OUTPATIENT_CLINIC_OR_DEPARTMENT_OTHER): Payer: Self-pay | Admitting: Obstetrics and Gynecology

## 2020-07-28 ENCOUNTER — Encounter (HOSPITAL_COMMUNITY): Payer: Self-pay | Admitting: Emergency Medicine

## 2020-07-28 ENCOUNTER — Emergency Department (HOSPITAL_COMMUNITY): Payer: Medicare HMO

## 2020-07-28 ENCOUNTER — Emergency Department (HOSPITAL_COMMUNITY)
Admission: EM | Admit: 2020-07-28 | Discharge: 2020-07-28 | Disposition: A | Discharge status: Home / Self Care | Payer: Medicare HMO | Attending: Emergency Medicine | Admitting: Emergency Medicine

## 2020-07-28 DIAGNOSIS — Y92410 Unspecified street and highway as the place of occurrence of the external cause: Secondary | ICD-10-CM | POA: Insufficient documentation

## 2020-07-28 DIAGNOSIS — Z79899 Other long term (current) drug therapy: Secondary | ICD-10-CM | POA: Diagnosis not present

## 2020-07-28 DIAGNOSIS — M545 Low back pain, unspecified: Secondary | ICD-10-CM | POA: Diagnosis not present

## 2020-07-28 DIAGNOSIS — Y9389 Activity, other specified: Secondary | ICD-10-CM | POA: Insufficient documentation

## 2020-07-28 DIAGNOSIS — Z7982 Long term (current) use of aspirin: Secondary | ICD-10-CM | POA: Insufficient documentation

## 2020-07-28 DIAGNOSIS — Z8616 Personal history of COVID-19: Secondary | ICD-10-CM | POA: Diagnosis not present

## 2020-07-28 DIAGNOSIS — I10 Essential (primary) hypertension: Secondary | ICD-10-CM | POA: Diagnosis not present

## 2020-07-28 DIAGNOSIS — M542 Cervicalgia: Secondary | ICD-10-CM | POA: Insufficient documentation

## 2020-07-28 MED ORDER — OXYCODONE-ACETAMINOPHEN 5-325 MG PO TABS
1.0000 | ORAL_TABLET | Freq: Once | ORAL | Status: AC
  Administered 2020-07-28: 1 via ORAL
  Filled 2020-07-28: qty 1

## 2020-07-28 MED ORDER — CYCLOBENZAPRINE HCL 10 MG PO TABS: 10.0000 mg | ORAL_TABLET | Freq: Every evening | ORAL | 0 refills | Status: AC | PRN

## 2020-07-28 MED ORDER — LORAZEPAM 1 MG PO TABS
1.0000 mg | ORAL_TABLET | Freq: Once | ORAL | Status: AC
  Administered 2020-07-28: 1 mg via ORAL
  Filled 2020-07-28: qty 1
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
## 2020-07-28 NOTE — Discharge Instructions (Signed)
As discussed, there is a risk of missed diagnosed injury to your neck without the proper imaging.  Please follow closely with your primary care provider. Please don't hesitate to return to the ED if symptoms worsen, if you develop new numbness/weakness in your upper extremities, or other concerning symptoms. You can take flexeril at bedtime as needed for muscle spasm. Treat your symptoms with over-the-counter medications as needed for pain. Apply ice for 20 minutes at a time to your areas of pain.

## 2020-07-30 NOTE — Progress Notes (Signed)
Spoke with pt daughter pt to reschedule surgery and pt to call dr leger office and make dr leger aware

## 2020-08-03 ENCOUNTER — Encounter (HOSPITAL_BASED_OUTPATIENT_CLINIC_OR_DEPARTMENT_OTHER): Payer: Self-pay

## 2020-08-03 ENCOUNTER — Ambulatory Visit (HOSPITAL_BASED_OUTPATIENT_CLINIC_OR_DEPARTMENT_OTHER): Admit: 2020-08-03 | Payer: Medicare HMO | Admitting: Obstetrics and Gynecology

## 2020-08-03 HISTORY — DX: Iron deficiency anemia, unspecified: D50.9

## 2020-08-03 HISTORY — DX: Personal history of transient ischemic attack (TIA), and cerebral infarction without residual deficits: Z86.73

## 2020-08-03 HISTORY — DX: Hyperlipidemia, unspecified: E78.5

## 2020-08-03 SURGERY — DILATATION & CURETTAGE/HYSTEROSCOPY WITH MYOSURE
Anesthesia: Choice
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
## 2021-09-25 ENCOUNTER — Other Ambulatory Visit: Payer: Self-pay

## 2021-09-25 ENCOUNTER — Encounter (HOSPITAL_BASED_OUTPATIENT_CLINIC_OR_DEPARTMENT_OTHER): Payer: Self-pay | Admitting: Obstetrics and Gynecology

## 2021-09-25 NOTE — Progress Notes (Signed)
Spoke w/ via phone for pre-op interview--- Drew----  RPR, EKG, UPT, ISTAT             Lab results------ COVID test -----patient states asymptomatic no test needed Arrive at -------0530 NPO after MN NO Solid Food.  Clear liquids from MN until--- Med rec completed Medications to take morning of surgery ----- Norvasc, bring Albuterol inhaler Diabetic medication ----- Patient instructed no nail polish to be worn day of surgery Patient instructed to bring photo id and insurance card day of surgery Patient aware to have Driver (ride ) / caregiver    for 24 hours after surgery  Patient Special Instructions ----- Pre-Op special Istructions ----- Patient verbalized understanding of instructions that were given at this phone interview. Patient denies shortness of breath, chest pain, fever, cough at this phone interview.

## 2021-09-25 NOTE — Progress Notes (Signed)
Patient called back requesting to change surgery time. Informed patient that she would need to contact Dr. Elaina Pattee office to change time/date of surgery. Telephone number given for [facility] surgery scheduler.

## 2021-10-01 NOTE — Anesthesia Preprocedure Evaluation (Addendum)
Anesthesia Evaluation  Patient identified by MRN, date of birth, ID band Patient awake    Reviewed: Allergy & Precautions, NPO status , Patient's Chart, lab work & pertinent test results  History of Anesthesia Complications Negative for: history of anesthetic complications  Airway Mallampati: II  TM Distance: >3 FB Neck ROM: Full    Dental  (+) Missing, Dental Advisory Given, Partial Upper   Pulmonary neg pulmonary ROS,    Pulmonary exam normal        Cardiovascular hypertension, Normal cardiovascular exam     Neuro/Psych CVA (2012), Residual Symptoms    GI/Hepatic negative GI ROS, Neg liver ROS,   Endo/Other  Morbid obesity  Renal/GU negative Renal ROS  negative genitourinary   Musculoskeletal negative musculoskeletal ROS (+)   Abdominal   Peds  Hematology negative hematology ROS (+) anemia ,   Anesthesia Other Findings   Reproductive/Obstetrics FIBROIDS, POLYP, HYPERPLASIA                            Anesthesia Physical Anesthesia Plan  ASA: 3  Anesthesia Plan: General   Post-op Pain Management: Toradol IV (intra-op) and Tylenol PO (pre-op)   Induction: Intravenous  PONV Risk Score and Plan: 3 and Ondansetron, Dexamethasone, Midazolam and Treatment may vary due to age or medical condition  Airway Management Planned: LMA  Additional Equipment: None  Intra-op Plan:   Post-operative Plan: Extubation in OR  Informed Consent: I have reviewed the patients History and Physical, chart, labs and discussed the procedure including the risks, benefits and alternatives for the proposed anesthesia with the patient or authorized representative who has indicated his/her understanding and acceptance.     Dental advisory given  Plan Discussed with:   Anesthesia Plan Comments:        Anesthesia Quick Evaluation

## 2021-10-02 ENCOUNTER — Ambulatory Visit (HOSPITAL_BASED_OUTPATIENT_CLINIC_OR_DEPARTMENT_OTHER): Payer: Medicare HMO | Admitting: Anesthesiology

## 2021-10-02 ENCOUNTER — Ambulatory Visit (HOSPITAL_BASED_OUTPATIENT_CLINIC_OR_DEPARTMENT_OTHER)
Admission: RE | Admit: 2021-10-02 | Discharge: 2021-10-02 | Disposition: A | Discharge status: Home / Self Care | Payer: Medicare HMO | Attending: Obstetrics and Gynecology | Admitting: Obstetrics and Gynecology

## 2021-10-02 ENCOUNTER — Encounter (HOSPITAL_BASED_OUTPATIENT_CLINIC_OR_DEPARTMENT_OTHER)
Admission: RE | Disposition: A | Discharge status: Home / Self Care | Payer: Self-pay | Source: Home / Self Care | Attending: Obstetrics and Gynecology

## 2021-10-02 ENCOUNTER — Encounter (HOSPITAL_BASED_OUTPATIENT_CLINIC_OR_DEPARTMENT_OTHER): Payer: Self-pay | Admitting: Obstetrics and Gynecology

## 2021-10-02 DIAGNOSIS — Z8616 Personal history of COVID-19: Secondary | ICD-10-CM | POA: Diagnosis not present

## 2021-10-02 DIAGNOSIS — Z6841 Body Mass Index (BMI) 40.0 and over, adult: Secondary | ICD-10-CM | POA: Diagnosis not present

## 2021-10-02 DIAGNOSIS — Z7982 Long term (current) use of aspirin: Secondary | ICD-10-CM | POA: Insufficient documentation

## 2021-10-02 DIAGNOSIS — N950 Postmenopausal bleeding: Secondary | ICD-10-CM | POA: Insufficient documentation

## 2021-10-02 DIAGNOSIS — I69354 Hemiplegia and hemiparesis following cerebral infarction affecting left non-dominant side: Secondary | ICD-10-CM | POA: Insufficient documentation

## 2021-10-02 DIAGNOSIS — D250 Submucous leiomyoma of uterus: Secondary | ICD-10-CM | POA: Diagnosis not present

## 2021-10-02 DIAGNOSIS — E785 Hyperlipidemia, unspecified: Secondary | ICD-10-CM | POA: Insufficient documentation

## 2021-10-02 DIAGNOSIS — N939 Abnormal uterine and vaginal bleeding, unspecified: Secondary | ICD-10-CM

## 2021-10-02 DIAGNOSIS — N8502 Endometrial intraepithelial neoplasia [EIN]: Secondary | ICD-10-CM | POA: Insufficient documentation

## 2021-10-02 DIAGNOSIS — N840 Polyp of corpus uteri: Secondary | ICD-10-CM

## 2021-10-02 DIAGNOSIS — D649 Anemia, unspecified: Secondary | ICD-10-CM | POA: Diagnosis not present

## 2021-10-02 HISTORY — PX: DILATATION & CURETTAGE/HYSTEROSCOPY WITH MYOSURE: SHX6511

## 2021-10-02 LAB — POCT I-STAT, CHEM 8
BUN: 9 mg/dL (ref 6–20)
Calcium, Ion: 1.08 mmol/L — ABNORMAL LOW (ref 1.15–1.40)
Chloride: 106 mmol/L (ref 98–111)
Creatinine, Ser: 0.6 mg/dL (ref 0.44–1.00)
Glucose, Bld: 108 mg/dL — ABNORMAL HIGH (ref 70–99)
HCT: 39 % (ref 36.0–46.0)
Hemoglobin: 13.3 g/dL (ref 12.0–15.0)
Potassium: 3.4 mmol/L — ABNORMAL LOW (ref 3.5–5.1)
Sodium: 141 mmol/L (ref 135–145)
TCO2: 25 mmol/L (ref 22–32)

## 2021-10-02 LAB — TYPE AND SCREEN
ABO/RH(D): A POS
Antibody Screen: NEGATIVE

## 2021-10-02 LAB — POCT PREGNANCY, URINE: Preg Test, Ur: NEGATIVE

## 2021-10-02 SURGERY — DILATATION & CURETTAGE/HYSTEROSCOPY WITH MYOSURE
Anesthesia: General | Site: Vagina

## 2021-10-02 MED ORDER — SODIUM CHLORIDE 0.9 % IR SOLN
Status: DC | PRN
  Administered 2021-10-02: 3000 mL
  Administered 2021-10-02: 6000 mL

## 2021-10-02 MED ORDER — POVIDONE-IODINE 10 % EX SWAB: 2.0000 "application " | Freq: Once | CUTANEOUS | Status: DC

## 2021-10-02 MED ORDER — IBUPROFEN 200 MG PO TABS
600.0000 mg | ORAL_TABLET | Freq: Once | ORAL | Status: AC
  Administered 2021-10-02: 11:00:00 600 mg via ORAL

## 2021-10-02 MED ORDER — ACETAMINOPHEN 500 MG PO TABS
ORAL_TABLET | ORAL | Status: AC
  Filled 2021-10-02: qty 2

## 2021-10-02 MED ORDER — PROPOFOL 10 MG/ML IV BOLUS
INTRAVENOUS | Status: DC | PRN
  Administered 2021-10-02: 150 mg via INTRAVENOUS

## 2021-10-02 MED ORDER — FENTANYL CITRATE (PF) 100 MCG/2ML IJ SOLN
INTRAMUSCULAR | Status: DC | PRN
  Administered 2021-10-02: 50 ug via INTRAVENOUS

## 2021-10-02 MED ORDER — PROPOFOL 500 MG/50ML IV EMUL
INTRAVENOUS | Status: AC
  Filled 2021-10-02: qty 50

## 2021-10-02 MED ORDER — LIDOCAINE HCL (CARDIAC) PF 100 MG/5ML IV SOSY
PREFILLED_SYRINGE | INTRAVENOUS | Status: DC | PRN
  Administered 2021-10-02: 100 mg via INTRAVENOUS

## 2021-10-02 MED ORDER — LACTATED RINGERS IV SOLN: INTRAVENOUS | Status: DC

## 2021-10-02 MED ORDER — ONDANSETRON HCL 4 MG/2ML IJ SOLN
INTRAMUSCULAR | Status: DC | PRN
  Administered 2021-10-02: 4 mg via INTRAVENOUS

## 2021-10-02 MED ORDER — PHENYLEPHRINE HCL (PRESSORS) 10 MG/ML IV SOLN
INTRAVENOUS | Status: DC | PRN
  Administered 2021-10-02 (×4): 80 ug via INTRAVENOUS
  Administered 2021-10-02 (×2): 40 ug via INTRAVENOUS

## 2021-10-02 MED ORDER — MIDAZOLAM HCL 2 MG/2ML IJ SOLN
INTRAMUSCULAR | Status: AC
  Filled 2021-10-02: qty 2

## 2021-10-02 MED ORDER — IBUPROFEN 200 MG PO TABS
ORAL_TABLET | ORAL | Status: AC
  Filled 2021-10-02: qty 3

## 2021-10-02 MED ORDER — ACETAMINOPHEN 500 MG PO TABS
1000.0000 mg | ORAL_TABLET | ORAL | Status: AC
  Administered 2021-10-02: 06:00:00 1000 mg via ORAL

## 2021-10-02 MED ORDER — MIDAZOLAM HCL 2 MG/2ML IJ SOLN
INTRAMUSCULAR | Status: DC | PRN
  Administered 2021-10-02: 2 mg via INTRAVENOUS

## 2021-10-02 MED ORDER — FENTANYL CITRATE (PF) 100 MCG/2ML IJ SOLN
INTRAMUSCULAR | Status: AC
  Filled 2021-10-02: qty 2

## 2021-10-02 MED ORDER — LIDOCAINE HCL 1 % IJ SOLN
INTRAMUSCULAR | Status: DC | PRN
  Administered 2021-10-02: 10 mL

## 2021-10-02 MED ORDER — DEXAMETHASONE SODIUM PHOSPHATE 4 MG/ML IJ SOLN
INTRAMUSCULAR | Status: DC | PRN
  Administered 2021-10-02: 8 mg via INTRAVENOUS

## 2021-10-02 SURGICAL SUPPLY — 19 items
DILATOR CANAL MILEX (MISCELLANEOUS) IMPLANT
DRSG TELFA 3X8 NADH (GAUZE/BANDAGES/DRESSINGS) ×3 IMPLANT
GAUZE 4X4 16PLY ~~LOC~~+RFID DBL (SPONGE) ×3 IMPLANT
GLOVE SURG ENC MOIS LTX SZ6.5 (GLOVE) ×3 IMPLANT
GLOVE SURG POLYISO LF SZ7 (GLOVE) ×2 IMPLANT
GLOVE SURG UNDER POLY LF SZ6.5 (GLOVE) ×3 IMPLANT
GLOVE SURG UNDER POLY LF SZ7 (GLOVE) ×5 IMPLANT
GOWN STRL REUS W/TWL LRG LVL3 (GOWN DISPOSABLE) ×6 IMPLANT
IV NS IRRIG 3000ML ARTHROMATIC (IV SOLUTION) ×7 IMPLANT
KIT PROCEDURE FLUENT (KITS) ×3 IMPLANT
KIT TURNOVER CYSTO (KITS) ×3 IMPLANT
MYOSURE XL FIBROID (MISCELLANEOUS) ×6
MYOSURE XL FIBROID REM (MISCELLANEOUS)
PACK VAGINAL MINOR WOMEN LF (CUSTOM PROCEDURE TRAY) ×3 IMPLANT
PAD DRESSING TELFA 3X8 NADH (GAUZE/BANDAGES/DRESSINGS) ×1 IMPLANT
PAD OB MATERNITY 4.3X12.25 (PERSONAL CARE ITEMS) ×3 IMPLANT
SEAL ROD LENS SCOPE MYOSURE (ABLATOR) ×3 IMPLANT
SYSTEM TISS REMOVAL MYOSURE XL (MISCELLANEOUS) IMPLANT
SYSTEM TISS REMOVAL MYSR XL RM (MISCELLANEOUS) IMPLANT

## 2021-10-02 NOTE — Discharge Instructions (Addendum)
DISCHARGE INSTRUCTIONS: D&C / D&E The following instructions have been prepared to help you care for yourself upon your return home.   Personal hygiene:  Use sanitary pads for vaginal drainage, not tampons.  Shower the day after your procedure.  NO tub baths, pools or Jacuzzis for 2-3 weeks.  Wipe front to back after using the bathroom.  Activity and limitations:  Do NOT drive or operate any equipment for 24 hours. The effects of anesthesia are still present and drowsiness may result.  Do NOT rest in bed all day.  Walking is encouraged.  Walk up and down stairs slowly.  You may resume your normal activity in one to two days or as indicated by your physician.  Sexual activity: NO intercourse for at least 2 weeks after the procedure, or as indicated by your physician.  Diet: Eat a light meal as desired this evening. You may resume your usual diet tomorrow.  Return to work: You may resume your work activities in one to two days or as indicated by your doctor.  What to expect after your surgery: Expect to have vaginal bleeding/discharge for 2-3 days and spotting for up to 10 days. It is not unusual to have soreness for up to 1-2 weeks. You may have a slight burning sensation when you urinate for the first day. Mild cramps may continue for a couple of days. You may have a regular period in 2-6 weeks.  Call your doctor for any of the following:  Excessive vaginal bleeding, saturating and changing one pad every hour.  Inability to urinate 6 hours after discharge from hospital.  Pain not relieved by pain medication.  Fever of 100.4 F or greater.  Unusual vaginal discharge or odor.   Call for an appointment:      Post Anesthesia Home Care Instructions  Activity: Get plenty of rest for the remainder of the day. A responsible adult should stay with you for 24 hours following the procedure.  For the next 24 hours, DO NOT: -Drive a car -Operate machinery -Drink alcoholic  beverages -Take any medication unless instructed by your physician -Make any legal decisions or sign important papers.  Meals: Start with liquid foods such as gelatin or soup. Progress to regular foods as tolerated. Avoid greasy, spicy, heavy foods. If nausea and/or vomiting occur, drink only clear liquids until the nausea and/or vomiting subsides. Call your physician if vomiting continues.  Special Instructions/Symptoms: Your throat may feel dry or sore from the anesthesia or the breathing tube placed in your throat during surgery. If this causes discomfort, gargle with warm salt water. The discomfort should disappear within 24 hours.   

## 2021-10-02 NOTE — Anesthesia Procedure Notes (Signed)
Procedure Name: LMA Insertion Date/Time: 10/02/2021 7:33 AM Performed by: Georgeanne Nim, CRNA Pre-anesthesia Checklist: Patient identified, Emergency Drugs available, Suction available, Patient being monitored and Timeout performed Patient Re-evaluated:Patient Re-evaluated prior to induction Oxygen Delivery Method: Circle system utilized Preoxygenation: Pre-oxygenation with 100% oxygen Induction Type: IV induction Ventilation: Mask ventilation without difficulty LMA: LMA inserted LMA Size: 4.0 Number of attempts: 1 Placement Confirmation: positive ETCO2, CO2 detector and breath sounds checked- equal and bilateral Tube secured with: Tape Dental Injury: Teeth and Oropharynx as per pre-operative assessment

## 2021-10-02 NOTE — Op Note (Signed)
PREOPERATIVE DIAGNOSES: 1. Submucosal fibroid 2. Postmenopausal bleeding 3. Polyp 4. Endometrial hyperplasia without atypia  POSTOPERATIVE DIAGNOSES: Same  PROCEDURE PERFORMED: Dilation, curretage, polypectomy, myomectomy, hysteroscopy  SURGEON: Dr. Lucillie Garfinkel  ANESTHESIA: General  ESTIMATED BLOOD LOSS: 50cc.  COMPLICATIONS: None  FLUID DEFICIT: 965cc  TUBES: None.  DRAINS: None  PATHOLOGY: Endometrial curretings and polyp  FINDINGS: On exam, under anesthesia, normal appearing vulva and vagina, 10 week sized uterus  Operative findings demonstrated two polyps and a plethora of fluffy endometrium. B/l ostia visualized  Procedure: The patient was taken to the operating room where she was properly prepped and draped in sterile manner under general anesthesia. After bimanual examination, the cervix was exposed with a weighted vaginal speculum and the anterior lip of the cervix grasped with a tenaculum.  The endocervical canal was then progressively dilated to 40mm. The hysteroscope was then introduced into the uterine cavity using sterile saline solution as a distending media and with attached video camera. The endometrial cavity was distended with fluids and the cavity with the b/l ostia were visualized. 2 large polyps noted. 1 large submucosal fibroid noted at the fundus. Myosure device was used to resect all areas en totality.  Sharp curretage was then performed. The scope was reintroduced and several pictures were taken of the endometrial cavity and the hysteroscope removed from the cavity. The sponge and lap counts were correct times 2 at this time. The patient's procedure was terminated. We then awakened her. She was sent to the Recovery Room in good condition.    Lucillie Garfinkel MD

## 2021-10-02 NOTE — Transfer of Care (Signed)
Immediate Anesthesia Transfer of Care Note  Patient: Cynthia [name]  Procedure(s) Performed: DILATATION & CURETTAGE/HYSTEROSCOPY WITH MYOSURE/ POLYPECTOMY/ MYOMECTOMY (Vagina )  Patient Location: PACU  Anesthesia Type:General  Level of Consciousness: awake, alert , oriented and patient cooperative  Airway & Oxygen Therapy: Patient Spontanous Breathing and Patient connected to nasal cannula oxygen  Post-op Assessment: Report given to RN and Post -op Vital signs reviewed and stable  Post vital signs: Reviewed and stable  Last Vitals:  Vitals Value Taken Time  BP    Temp    Pulse 79 10/02/21 0849  Resp 17 10/02/21 0849  SpO2 95 % 10/02/21 0849  Vitals shown include unvalidated device data.  Last Pain:  Vitals:   10/02/21 0618  TempSrc: Oral  PainSc: 0-No pain      Patients Stated Pain Goal: 3 (03/35/33 1740)  Complications: No notable events documented.

## 2021-10-02 NOTE — Anesthesia Postprocedure Evaluation (Signed)
Anesthesia Post Note  Patient: Cynthia [name]  Procedure(s) Performed: DILATATION & CURETTAGE/HYSTEROSCOPY WITH MYOSURE/ POLYPECTOMY/ MYOMECTOMY (Vagina )     Patient location during evaluation: PACU Anesthesia Type: General Level of consciousness: awake and alert Pain management: pain level controlled Vital Signs Assessment: post-procedure vital signs reviewed and stable Respiratory status: spontaneous breathing, nonlabored ventilation and respiratory function stable Cardiovascular status: blood pressure returned to baseline and stable Postop Assessment: no apparent nausea or vomiting Anesthetic complications: no   No notable events documented.  Last Vitals:  Vitals:   10/02/21 0935 10/02/21 0937  BP:    Pulse: 83 78  Resp: 15 14  Temp:  36.6 C  SpO2: 95% 92%    Last Pain:  Vitals:   10/02/21 1003  TempSrc:   PainSc: 0-No pain                 Lidia Collum

## 2021-10-02 NOTE — Progress Notes (Signed)
No updates to above H&P. Patient arrived NPO and was consented in PACU. Risks again discussed, all questions answered, and consent signed. Proceed with above surgery.    Khaled Herda MD  

## 2021-10-02 NOTE — Progress Notes (Signed)
Spoke with Dr. Royston Sinner and okay for patient to take Ibuprofen only if it's okay with her primary MD, and okay for her to have some now for headache. Otherwise Tylenol is to be taken at home as needed for pain control. Patient and her dtr- Rosyln informed of this and voiced understanding. Instructed on next dose of Tylenol to be at 12:30 pm today if needed and no NSAIDS until after 4:45 pm today and only if approved by primary MD, and information added to discharge instructions.

## 2021-10-03 ENCOUNTER — Encounter (HOSPITAL_BASED_OUTPATIENT_CLINIC_OR_DEPARTMENT_OTHER): Payer: Self-pay | Admitting: Obstetrics and Gynecology

## 2021-10-03 LAB — SURGICAL PATHOLOGY

## 2022-05-07 ENCOUNTER — Other Ambulatory Visit: Payer: Self-pay | Admitting: Family Medicine

## 2022-05-07 DIAGNOSIS — R1084 Generalized abdominal pain: Secondary | ICD-10-CM

## 2022-05-16 ENCOUNTER — Ambulatory Visit
Admission: RE | Admit: 2022-05-16 | Discharge: 2022-05-16 | Disposition: A | Discharge status: Home / Self Care | Payer: Medicare HMO | Source: Ambulatory Visit | Attending: Family Medicine | Admitting: Family Medicine

## 2022-05-16 DIAGNOSIS — R1084 Generalized abdominal pain: Secondary | ICD-10-CM

## 2022-06-06 ENCOUNTER — Other Ambulatory Visit: Payer: Self-pay | Admitting: Family Medicine

## 2022-06-06 ENCOUNTER — Ambulatory Visit
Admission: RE | Admit: 2022-06-06 | Discharge: 2022-06-06 | Disposition: A | Discharge status: Home / Self Care | Payer: Medicare HMO | Source: Ambulatory Visit | Attending: Family Medicine | Admitting: Family Medicine

## 2022-06-06 DIAGNOSIS — M79674 Pain in right toe(s): Secondary | ICD-10-CM

## 2022-06-16 ENCOUNTER — Other Ambulatory Visit: Payer: Self-pay | Admitting: Family Medicine

## 2022-06-16 DIAGNOSIS — N200 Calculus of kidney: Secondary | ICD-10-CM

## 2022-07-09 ENCOUNTER — Inpatient Hospital Stay: Admission: RE | Admit: 2022-07-09 | Payer: Medicare HMO | Source: Ambulatory Visit

## 2022-07-24 ENCOUNTER — Other Ambulatory Visit: Payer: Self-pay | Admitting: Family Medicine

## 2022-07-24 DIAGNOSIS — Z1231 Encounter for screening mammogram for malignant neoplasm of breast: Secondary | ICD-10-CM

## 2023-09-16 ENCOUNTER — Encounter: Payer: Self-pay | Admitting: Plastic Surgery

## 2023-09-16 ENCOUNTER — Ambulatory Visit (INDEPENDENT_AMBULATORY_CARE_PROVIDER_SITE_OTHER): Payer: Self-pay | Admitting: Plastic Surgery

## 2023-09-16 VITALS — BP 157/94 | HR 101 | Ht 69.0 in | Wt 258.9 lb

## 2023-09-16 DIAGNOSIS — Z719 Counseling, unspecified: Secondary | ICD-10-CM

## 2023-09-16 NOTE — Progress Notes (Signed)
Patient ID: Cynthia [name], female    DOB: [birth year], 57 y.o.   MRN: [id number]   Chief Complaint  Patient presents with   Advice Only    The patient is a 57 year old female here for evaluation of her nose.  She contracted chickenpox as an adult and has couple of scars on her face as a result.  There is 1 on her nose just to the left of midline and then 1 on her forehead in the glabellar area.  The patient has had filler in the past which seem to help.  She had a peel recently that seem to make the areas more pronounced.  She went to a family reunion and got a lot of questions about her nose which created a lot of stress.  Patient states she does not want to really go outside.  Psychological support was recommended.  No other concerning lesions were noted.    Review of Systems  Constitutional: Negative.   HENT: Negative.    Eyes: Negative.   Respiratory: Negative.    Cardiovascular: Negative.   Endocrine: Negative.   Genitourinary: Negative.     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    Past Surgical History:  Procedure Laterality Date   DILATATION & CURETTAGE/HYSTEROSCOPY WITH MYOSURE N/A 10/02/2021   Procedure: DILATATION & CURETTAGE/HYSTEROSCOPY WITH MYOSURE/ POLYPECTOMY/ MYOMECTOMY;  Surgeon: Ranae Pila, MD;  Location: [facility];  Service: Gynecology;  Laterality: N/A;   TUBAL LIGATION        Current Outpatient Medications:    albuterol (VENTOLIN HFA) 108 (90 Base) MCG/ACT inhaler, Inhale 2 puffs into the lungs every 4 (four) hours as needed for wheezing or shortness of breath., Disp: 18 g, Rfl: 0   ALPRAZolam (XANAX) 0.5 MG tablet, alprazolam 0.5 mg tablet  Take 1 tablet by oral  route., Disp: , Rfl:    amLODipine (NORVASC) 5 MG tablet, Take 5 mg by mouth daily.  , Disp: , Rfl:    Ascorbic Acid (VITAMIN C) 1000 MG tablet, Take 1,000 mg by mouth daily., Disp: , Rfl:    aspirin 81 MG tablet, Take 81 mg by mouth daily., Disp: , Rfl:    benzonatate (TESSALON) 100 MG capsule, Take 1 capsule (100 mg total) by mouth every 8 (eight) hours., Disp: 21 capsule, Rfl: 0   cetirizine (ZYRTEC ALLERGY) 10 MG tablet, Take 1 tablet (10 mg total) by mouth daily., Disp: 30 tablet, Rfl: 0   cyclobenzaprine (FLEXERIL) 10 MG tablet, Take 1 tablet (10 mg total) by mouth at bedtime as needed for muscle spasms., Disp: 10 tablet, Rfl: 0   fluticasone (FLONASE) 50 MCG/ACT nasal spray, Place 1 spray into both nostrils daily., Disp: 16 g, Rfl: 0   lisinopril (ZESTRIL) 20 MG tablet, lisinopril 20 mg tablet  TAKE 1 TABLET BY MOUTH ONCE DAILY, Disp: , Rfl:    Multiple Vitamin (MULTIVITAMIN WITH MINERALS) TABS tablet, Take 1 tablet by mouth daily., Disp: , Rfl:    rivastigmine (EXELON) 9.5 mg/24hr, Place 1 patch onto the skin daily.  , Disp: , Rfl:    simvastatin (ZOCOR) 40 MG tablet, Take 40 mg by mouth at bedtime.  , Disp: , Rfl:  traZODone (DESYREL) 50 MG tablet, Take 50 mg by mouth at bedtime as needed. For sleep. , Disp: , Rfl:    Objective:   Vitals:   09/16/23 1400  BP: (!) 157/94  Pulse: (!) 101  SpO2: 96%    Physical Exam Constitutional:      Appearance: Normal appearance.  Cardiovascular:     Rate and Rhythm: Normal rate.  Neurological:     Mental Status: She is alert and oriented to person, place, and time.  Psychiatric:        Mood and Affect: Mood normal.        Behavior: Behavior normal.        Thought Content: Thought content normal.        Judgment: Judgment normal.     Assessment & Plan:  Encounter for counseling  Talked about options to try and improve the look of the nose and the forehead.  Laser is an option with the TRL.  We did a test dose on her wrist to see  how she would respond.  I went to be sure I do not give her any hypo or hyperpigmentation.  Another option is to simply cut it out.  She would be left with a straight line scar which would probably be hidden quite nicely over time.  The other option is the filler but insurance will not cover it and it will be fee-for-service.  Patient's go to think it over and then come back in a month and we will see how she responded to the TRL on her sample spot on her wrist.  Pictures were obtained of the patient and placed in the chart with the patient's or guardian's permission.   Alena Bills Aviona Martenson, DO

## 2023-10-13 ENCOUNTER — Other Ambulatory Visit: Payer: Self-pay | Admitting: Plastic Surgery

## 2023-12-18 ENCOUNTER — Emergency Department (HOSPITAL_COMMUNITY)
Admission: EM | Admit: 2023-12-18 | Discharge: 2023-12-18 | Discharge status: Against Medical Advice | Attending: Emergency Medicine | Admitting: Emergency Medicine

## 2023-12-18 ENCOUNTER — Other Ambulatory Visit: Payer: Self-pay | Admitting: Plastic Surgery

## 2023-12-18 DIAGNOSIS — Z5321 Procedure and treatment not carried out due to patient leaving prior to being seen by health care provider: Secondary | ICD-10-CM | POA: Diagnosis not present

## 2023-12-18 DIAGNOSIS — R531 Weakness: Secondary | ICD-10-CM | POA: Diagnosis present

## 2023-12-18 NOTE — ED Notes (Signed)
 Pt has left sided weakness

## 2024-01-05 ENCOUNTER — Other Ambulatory Visit: Payer: Self-pay | Admitting: Plastic Surgery

## 2024-04-03 ENCOUNTER — Emergency Department (HOSPITAL_COMMUNITY)
Admission: EM | Admit: 2024-04-03 | Discharge: 2024-04-03 | Disposition: A | Discharge status: Home / Self Care | Attending: Emergency Medicine | Admitting: Emergency Medicine

## 2024-04-03 ENCOUNTER — Other Ambulatory Visit: Payer: Self-pay

## 2024-04-03 DIAGNOSIS — Z79899 Other long term (current) drug therapy: Secondary | ICD-10-CM | POA: Insufficient documentation

## 2024-04-03 DIAGNOSIS — Z7982 Long term (current) use of aspirin: Secondary | ICD-10-CM | POA: Diagnosis not present

## 2024-04-03 DIAGNOSIS — N939 Abnormal uterine and vaginal bleeding, unspecified: Secondary | ICD-10-CM | POA: Diagnosis present

## 2024-04-03 DIAGNOSIS — Z8616 Personal history of COVID-19: Secondary | ICD-10-CM | POA: Insufficient documentation

## 2024-04-03 DIAGNOSIS — I10 Essential (primary) hypertension: Secondary | ICD-10-CM | POA: Insufficient documentation

## 2024-04-03 LAB — CBC WITH DIFFERENTIAL/PLATELET
Abs Immature Granulocytes: 0.01 10*3/uL (ref 0.00–0.07)
Basophils Absolute: 0 10*3/uL (ref 0.0–0.1)
Basophils Relative: 1 %
Eosinophils Absolute: 0.2 10*3/uL (ref 0.0–0.5)
Eosinophils Relative: 4 %
HCT: 36.4 % (ref 36.0–46.0)
Hemoglobin: 11.4 g/dL — ABNORMAL LOW (ref 12.0–15.0)
Immature Granulocytes: 0 %
Lymphocytes Relative: 35 %
Lymphs Abs: 2 10*3/uL (ref 0.7–4.0)
MCH: 28.4 pg (ref 26.0–34.0)
MCHC: 31.3 g/dL (ref 30.0–36.0)
MCV: 90.5 fL (ref 80.0–100.0)
Monocytes Absolute: 0.3 10*3/uL (ref 0.1–1.0)
Monocytes Relative: 6 %
Neutro Abs: 3.3 10*3/uL (ref 1.7–7.7)
Neutrophils Relative %: 54 %
Platelets: 268 10*3/uL (ref 150–400)
RBC: 4.02 MIL/uL (ref 3.87–5.11)
RDW: 13.6 % (ref 11.5–15.5)
WBC: 5.9 10*3/uL (ref 4.0–10.5)
nRBC: 0 % (ref 0.0–0.2)

## 2024-04-03 LAB — COMPREHENSIVE METABOLIC PANEL WITH GFR
ALT: 18 U/L (ref 0–44)
AST: 18 U/L (ref 15–41)
Albumin: 3.9 g/dL (ref 3.5–5.0)
Alkaline Phosphatase: 81 U/L (ref 38–126)
Anion gap: 7 (ref 5–15)
BUN: 11 mg/dL (ref 6–20)
CO2: 26 mmol/L (ref 22–32)
Calcium: 8.8 mg/dL — ABNORMAL LOW (ref 8.9–10.3)
Chloride: 106 mmol/L (ref 98–111)
Creatinine, Ser: 0.49 mg/dL (ref 0.44–1.00)
GFR, Estimated: >60 mL/min (ref >60)
Glucose, Bld: 96 mg/dL (ref 70–99)
Potassium: 3.8 mmol/L (ref 3.5–5.1)
Sodium: 139 mmol/L (ref 135–145)
Total Bilirubin: 0.7 mg/dL (ref 0.0–1.2)
Total Protein: 7.5 g/dL (ref 6.5–8.1)

## 2024-04-03 MED ORDER — KETOROLAC TROMETHAMINE 15 MG/ML IJ SOLN
10.0000 mg | Freq: Once | INTRAMUSCULAR | Status: AC
  Administered 2024-04-03: 10 mg via INTRAMUSCULAR
  Filled 2024-04-03: qty 1

## 2024-04-03 MED ORDER — KETOROLAC TROMETHAMINE 15 MG/ML IJ SOLN: 10.0000 mg | Freq: Once | INTRAMUSCULAR | Status: DC

## 2024-04-03 NOTE — ED Provider Notes (Signed)
 [place] EMERGENCY DEPARTMENT AT [facility] Provider Note   CSN: [id number] Arrival date & time: 04/03/24  9040     Patient presents with: Vaginal Bleeding   Cynthia [name] is a 58 y.o. female history of CVA, hyperlipidemia, hypertension presents with complaints of vaginal bleeding that started last night associated with vaginal cramping pain.  Patient states she only had 1 episode of vaginal bleeding.  No urinary symptoms.  No vaginal discharge.  Reports she did have a IUD placed 2 years ago.  The last time it was checked was a year ago.  She is not on blood thinners.  Noted to be hypertensive upon arrival.  Reports she did take her blood pressure medication this morning, however she has been rather noncompliant recently with increased stress.  She is asymptomatic in this regard without any headache, vision changes, dizziness, weakness, chest pain or shortness of breath.    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 Authorizing Provider  albuterol  (VENTOLIN  HFA) 108 (90 Base) MCG/ACT inhaler Inhale 2 puffs into the lungs every 4 (four) hours as needed for wheezing or shortness of breath. 03/23/20   Hall-Potvin, [place], PA-C  ALPRAZolam (XANAX) 0.5 MG tablet alprazolam 0.5 mg tablet  Take 1 tablet by oral route.    [provider]  amLODipine (NORVASC) 5 MG tablet Take 5 mg by mouth daily.      [provider]  Ascorbic Acid (VITAMIN C) 1000 MG tablet Take 1,000 mg by mouth daily.    [provider]  aspirin 81 MG tablet Take 81 mg by mouth daily.    [provider]  benzonatate   (TESSALON ) 100 MG capsule Take 1 capsule (100 mg total) by mouth every 8 (eight) hours. 03/23/20   Hall-Potvin, [place], PA-C  cetirizine  (ZYRTEC  ALLERGY) 10 MG tablet Take 1 tablet (10 mg total) by mouth daily. 03/23/20   Hall-Potvin, [place], PA-C  cyclobenzaprine  (FLEXERIL ) 10 MG tablet Take 1 tablet (10 mg total) by mouth at bedtime as needed for muscle spasms. 07/28/20   Robinson, Jordan N, PA-C  fluticasone  (FLONASE ) 50 MCG/ACT nasal spray Place 1 spray into both nostrils daily. 03/23/20   Hall-Potvin, [place], PA-C  lisinopril (ZESTRIL) 20 MG tablet lisinopril 20 mg tablet  TAKE 1 TABLET BY MOUTH ONCE DAILY 08/08/15   [provider]  Multiple Vitamin (MULTIVITAMIN WITH MINERALS) TABS tablet Take 1 tablet by mouth daily.    [provider]  rivastigmine (EXELON) 9.5 mg/24hr Place 1 patch onto the skin daily.      [provider]  simvastatin (ZOCOR) 40 MG tablet Take 40 mg by mouth at bedtime.      [provider]  traZODone (DESYREL) 50 MG tablet Take 50 mg by mouth at bedtime as needed. For sleep.     [provider]    Allergies: Patient has no known allergies.    Review of Systems  Genitourinary:  Positive for vaginal bleeding.    Updated Vital Signs BP (!) 170/106 (BP Location: Right Arm)   Pulse 77   Temp 98.3  F (36.8 C) (Oral)   Resp 18   Ht 5' 9 (1.753 m)   Wt 95.3 kg   SpO2 94%   BMI 31.01 kg/m   Physical Exam Vitals and nursing note reviewed.  Constitutional:      General: She is not in acute distress.    Appearance: She is well-developed.  HENT:     Head: Normocephalic and atraumatic.   Eyes:     Conjunctiva/sclera: Conjunctivae normal.    Cardiovascular:     Rate and Rhythm: Normal rate and regular rhythm.     Heart sounds: No murmur heard. Pulmonary:     Effort: Pulmonary effort is normal. No respiratory distress.     Breath sounds: Normal breath sounds.  Abdominal:     Palpations: Abdomen is soft.      Tenderness: There is no abdominal tenderness.   Musculoskeletal:        General: No swelling.     Cervical back: Neck supple.   Skin:    General: Skin is warm and dry.     Capillary Refill: Capillary refill takes less than 2 seconds.   Neurological:     Mental Status: She is alert.   Psychiatric:        Mood and Affect: Mood normal.     (all labs ordered are listed, but only abnormal results are displayed) Labs Reviewed  CBC WITH DIFFERENTIAL/PLATELET - Abnormal; Notable for the following components:      Result Value   Hemoglobin 11.4 (*)    All other components within normal limits  COMPREHENSIVE METABOLIC PANEL WITH GFR - Abnormal; Notable for the following components:   Calcium 8.8 (*)    All other components within normal limits    EKG: None  Radiology: No results found.   Procedures   Medications Ordered in the ED  ketorolac  (TORADOL ) 15 MG/ML injection 10 mg (has no administration in time range)    Clinical Course as of 04/03/24 1317  Sun Apr 03, 2024  1307 Immature Granulocytes: 0 [JT]    Clinical Course User Index [JT] Donnajean Lynwood DEL, PA-C                                 Medical Decision Making  This patient presents to the ED with chief complaint(s) of vaginal bleeding.  The complaint involves an extensive differential diagnosis and also carries with it a high risk of complications and morbidity.   Pertinent past medical history as listed in HPI  The differential diagnosis includes  Malignancy, polyp, pneumonia, endometritis Additional history obtained: Additional history obtained from family Records reviewed Care Everywhere/External Records  Assessment and management:   Patient presents hypertensive with complaints of vaginal bleeding that started last night.  She states she only had 1 episode.  Has not had to replace pads since.  She reports associated vaginal cramping pain with movement.  Denies any urinary symptoms or vaginal discharge.   Does report that she had polyps in the past and this feels similar.  She is not on blood thinners.  Noted to be hypertensive upon arrival.  She is asymptomatic however.  Do not suspect CVA, TIA, ACS or hypertensive emergency.  Fortunately we do not have ultrasound available today.  Offered pelvic exam.  Patient declined she would like to hold off to be evaluated by her PCP.  Did provide patient with a outpatient slip for transvaginal ultrasound.  Will minister  shot of Toradol  while she is here and discharged home.  Lab work is overall reassuring with a stable hemoglobin.  Independent ECG interpretation:  none  Independent labs interpretation:  The following labs were independently interpreted:  CBC and CMP without significant abnormality  Independent visualization and interpretation of imaging: I independently visualized the following imaging with scope of interpretation limited to determining acute life threatening conditions related to emergency care: Transvaginal ultrasound   Consultations obtained:   none  Disposition:   Patient will be discharged home. The patient has been appropriately medically screened and/or stabilized in the ED. I have low suspicion for any other emergent medical condition which would require further screening, evaluation or treatment in the ED or require inpatient management. At time of discharge the patient is hemodynamically stable and in no acute distress. I have discussed work-up results and diagnosis with patient and answered all questions. Patient is agreeable with discharge plan. We discussed strict return precautions for returning to the emergency department and they verbalized understanding.     Social Determinants of Health:   none  This note was dictated with voice recognition software.  Despite best efforts at proofreading, errors may have occurred which can change the documentation meaning.       Final diagnoses:  Vaginal bleeding    ED  Discharge Orders     None          Donnajean Lynwood VEAR DEVONNA 04/03/24 1318    Mannie Pac T, DO 04/09/24 1504

## 2024-04-03 NOTE — Discharge Instructions (Addendum)
 You were evaluated in the emergency room for vaginal bleeding.  Your lab work did not show any significant abnormality.  Unfortunately we do not have ultrasound available today.  You may return tomorrow for a outpatient ultrasound.  Please follow-up with your primary care doctor as discussed.

## 2024-04-03 NOTE — ED Triage Notes (Signed)
 Patient to ED by POV  with c/o vaginal bleeding since last night. States she had IUD procedure over a year ago. Her on call triage nurse informed them to f/u in ED. She has pulsating pain in vaginal area. Only had one occurrence blood is right read.

## 2024-08-03 ENCOUNTER — Emergency Department (HOSPITAL_COMMUNITY)

## 2024-08-03 ENCOUNTER — Emergency Department (HOSPITAL_COMMUNITY)
Admission: EM | Admit: 2024-08-03 | Discharge: 2024-08-03 | Disposition: A | Discharge status: Home / Self Care | Attending: Emergency Medicine | Admitting: Emergency Medicine

## 2024-08-03 ENCOUNTER — Encounter (HOSPITAL_COMMUNITY): Payer: Self-pay | Admitting: Emergency Medicine

## 2024-08-03 DIAGNOSIS — I10 Essential (primary) hypertension: Secondary | ICD-10-CM | POA: Insufficient documentation

## 2024-08-03 DIAGNOSIS — D649 Anemia, unspecified: Secondary | ICD-10-CM | POA: Diagnosis not present

## 2024-08-03 DIAGNOSIS — R42 Dizziness and giddiness: Secondary | ICD-10-CM | POA: Diagnosis not present

## 2024-08-03 DIAGNOSIS — Z79899 Other long term (current) drug therapy: Secondary | ICD-10-CM | POA: Diagnosis not present

## 2024-08-03 DIAGNOSIS — R072 Precordial pain: Secondary | ICD-10-CM | POA: Insufficient documentation

## 2024-08-03 DIAGNOSIS — R079 Chest pain, unspecified: Secondary | ICD-10-CM

## 2024-08-03 LAB — CBC
HCT: 38.7 % (ref 36.0–46.0)
Hemoglobin: 11.8 g/dL — ABNORMAL LOW (ref 12.0–15.0)
MCH: 27.9 pg (ref 26.0–34.0)
MCHC: 30.5 g/dL (ref 30.0–36.0)
MCV: 91.5 fL (ref 80.0–100.0)
Platelets: 284 K/uL (ref 150–400)
RBC: 4.23 MIL/uL (ref 3.87–5.11)
RDW: 13.6 % (ref 11.5–15.5)
WBC: 8.3 K/uL (ref 4.0–10.5)
nRBC: 0 % (ref 0.0–0.2)

## 2024-08-03 LAB — COMPREHENSIVE METABOLIC PANEL WITH GFR
ALT: 17 U/L (ref 0–44)
AST: 16 U/L (ref 15–41)
Albumin: 4.3 g/dL (ref 3.5–5.0)
Alkaline Phosphatase: 94 U/L (ref 38–126)
Anion gap: 10 (ref 5–15)
BUN: 13 mg/dL (ref 6–20)
CO2: 26 mmol/L (ref 22–32)
Calcium: 9.7 mg/dL (ref 8.9–10.3)
Chloride: 106 mmol/L (ref 98–111)
Creatinine, Ser: 0.62 mg/dL (ref 0.44–1.00)
GFR, Estimated: >60 mL/min (ref >60)
Glucose, Bld: 106 mg/dL — ABNORMAL HIGH (ref 70–99)
Potassium: 3.9 mmol/L (ref 3.5–5.1)
Sodium: 142 mmol/L (ref 135–145)
Total Bilirubin: 0.5 mg/dL (ref 0.0–1.2)
Total Protein: 7.5 g/dL (ref 6.5–8.1)

## 2024-08-03 LAB — TROPONIN T, HIGH SENSITIVITY
Troponin T High Sensitivity: <15 ng/L (ref 0–19)
Troponin T High Sensitivity: <15 ng/L (ref 0–19)

## 2024-08-03 LAB — D-DIMER, QUANTITATIVE: D-Dimer, Quant: 0.35 ug{FEU}/mL (ref 0.00–0.50)

## 2024-08-03 MED ORDER — ASPIRIN 81 MG PO CHEW
324.0000 mg | CHEWABLE_TABLET | Freq: Once | ORAL | Status: AC
  Administered 2024-08-03: 324 mg via ORAL
  Filled 2024-08-03: qty 4

## 2024-08-03 MED ORDER — LISINOPRIL 10 MG PO TABS
20.0000 mg | ORAL_TABLET | Freq: Once | ORAL | Status: AC
  Administered 2024-08-03: 20 mg via ORAL
  Filled 2024-08-03: qty 2

## 2024-08-03 NOTE — Discharge Instructions (Addendum)
 Cynthia [name]  Thank you for allowing us  to take care of you today.  You came to the Emergency Department today because you have had intermittent chest pain over the past 4 weeks.  Here in the emergency department your EKG was reassuring, your heart enzymes were negative twice, therefore you are not having a heart attack.  Your blood clot risk factor number was negative, your chest x-ray did not show any signs of pneumonia, or fluid or air around your lung that can be there.  Additionally your blood counts, blood salts, and kidney numbers are reassuring.  We recommend following up with your primary care doctor to determine if you need further adjustment in your blood pressure medication, and given you do have cardiac risk factors, we are going to refer you to follow-up with a cardiologist outpatient.  If you have not heard from the Cardiology office within the next 72 hours please call [phone].  To-Do: 1. Please follow-up with your primary doctor within 1 - 2 weeks / as soon as possible.   Please return to the Emergency Department or call 911 if you experience have worsening of your symptoms, or do not get better, new or different chest pain, shortness of breath, severe or significantly worsening pain, high fever, severe confusion, pass out or have any reason to think that you need emergency medical care.   We hope you feel better soon.   Mitzie Later, MD Department of Emergency Medicine [facility] [place]

## 2024-08-03 NOTE — ED Triage Notes (Signed)
 Pt arriving POV with daughter for intermittent chest pain and abdominal pain.

## 2024-08-03 NOTE — ED Provider Notes (Signed)
 [place] EMERGENCY DEPARTMENT AT [facility] Provider Note   CSN: [id number] Arrival date & time: 08/03/24  9088     History Chief Complaint  Patient presents with   Chest Pain    HPI: Naina Sleeper is a 58 y.o. female with history pertinent for hypertension, hyperlipidemia, prior CVA with residual left-sided deficits, IDA who presents complaining of chest pain. Patient arrived via POV accompanied by daughter.  History provided by patient and relative: Daughter.  No interpreter required during this encounter.  Patient reports that she has had intermittent chest pain over the past several weeks.  Reports that this has occurred in the setting of having a cough for several weeks where she is currently on antibiotics.  Occasionally she will have mild lightheadedness, has not had any syncope or collapse.  Reports that pain is usually mild, either sharp or squeezing.  Reports that last night at approximately 10 PM she had an episode of severe chest pain.  Reports that she was eating while in bed and it was a severe substernal pressure, nonradiating.  She thought that it could be due to the tightness of her sports bra, however adjusting this did not alleviate her pain, nor did standing.  She had not taken her medications, therefore her daughter administered her home antihypertensives, hypercholesterol medications, as well as home aspirin, patient had partial resolution of pain, however pain was still intermittently present this morning, therefore they decided to come to the emergency department for further evaluation.  Patient denies any fevers, chills, productive cough, nausea, vomiting, diarrhea, dysuria.  Patient's recorded medical, surgical, social, medication list and allergies were reviewed in the Snapshot window as part of the initial history.   Prior to Admission medications   Medication Sig Start Date End Date Taking? Authorizing Provider  albuterol  (VENTOLIN  HFA) 108  (90 Base) MCG/ACT inhaler Inhale 2 puffs into the lungs every 4 (four) hours as needed for wheezing or shortness of breath. 03/23/20   Hall-Potvin, Brittany, PA-C  ALPRAZolam (XANAX) 0.5 MG tablet alprazolam 0.5 mg tablet  Take 1 tablet by oral route.    [provider]  amLODipine (NORVASC) 5 MG tablet Take 5 mg by mouth daily.      [provider]  Ascorbic Acid (VITAMIN C) 1000 MG tablet Take 1,000 mg by mouth daily.    [provider]  aspirin 81 MG tablet Take 81 mg by mouth daily.    [provider]  benzonatate  (TESSALON ) 100 MG capsule Take 1 capsule (100 mg total) by mouth every 8 (eight) hours. 03/23/20   Hall-Potvin, Brittany, PA-C  cetirizine  (ZYRTEC  ALLERGY) 10 MG tablet Take 1 tablet (10 mg total) by mouth daily. 03/23/20   Hall-Potvin, Brittany, PA-C  cyclobenzaprine  (FLEXERIL ) 10 MG tablet Take 1 tablet (10 mg total) by mouth at bedtime as needed for muscle spasms. 07/28/20   Robinson, Jordan N, PA-C  fluticasone  (FLONASE ) 50 MCG/ACT nasal spray Place 1 spray into both nostrils daily. 03/23/20   Hall-Potvin, Brittany, PA-C  lisinopril (ZESTRIL) 20 MG tablet lisinopril 20 mg tablet  TAKE 1 TABLET BY MOUTH ONCE DAILY 08/08/15   [provider]  Multiple Vitamin (MULTIVITAMIN WITH MINERALS) TABS tablet Take 1 tablet by mouth daily.    [provider]  rivastigmine (EXELON) 9.5 mg/24hr Place 1 patch onto the skin daily.      [provider]  simvastatin (ZOCOR) 40 MG tablet Take 40 mg by mouth at bedtime.      [provider]  traZODone (DESYREL) 50 MG tablet Take 50 mg by mouth at bedtime as needed. For sleep.     [provider]     Allergies: Patient has no known allergies.   Review of Systems   ROS as per HPI  Physical Exam Updated Vital Signs BP (!) 190/101 (BP Location: Right Arm)   Pulse 87   Temp 98.4 F (36.9 C) (Oral)   Resp (!) 22   SpO2 98%  Physical Exam Vitals and nursing note  reviewed.  Constitutional:      General: She is not in acute distress.    Appearance: She is well-developed.  HENT:     Head: Normocephalic and atraumatic.  Eyes:     Conjunctiva/sclera: Conjunctivae normal.  Cardiovascular:     Rate and Rhythm: Normal rate and regular rhythm.     Heart sounds: No murmur heard. Pulmonary:     Effort: Pulmonary effort is normal. No respiratory distress.     Breath sounds: Normal breath sounds.  Abdominal:     Palpations: Abdomen is soft.     Tenderness: There is no abdominal tenderness.  Musculoskeletal:        General: No swelling.     Cervical back: Neck supple.     Right lower leg: No tenderness. No edema.     Left lower leg: No tenderness. No edema.  Skin:    General: Skin is warm and dry.     Capillary Refill: Capillary refill takes less than 2 seconds.  Neurological:     Mental Status: She is alert.  Psychiatric:        Mood and Affect: Mood normal.     ED Course/ Medical Decision Making/ A&P    Procedures Procedures   Medications Ordered in ED Medications  aspirin chewable tablet 324 mg (324 mg Oral Given 08/03/24 1034)  lisinopril (ZESTRIL) tablet 20 mg (20 mg Oral Given 08/03/24 1304)    Medical Decision Making:   Sharaine Delange is a 58 y.o. female who presents for chest pain as per above.  Physical exam is pertinent for no focal abnormalities.   The differential includes but is not limited to ACS, arrhythmia, pericardial tamponade, pericarditis, myocarditis, pneumonia, pneumothorax, esophageal, tear, perforated abdominal viscous, pulmonary embolism, aortic dissection, costochondritis, musculoskeletal chest wall pain, GERD.  Independent historian: Relative: Daughter  External data reviewed: Labs: Reviewed prior labs for baseline  Labs: Ordered, Independent interpretation, and Details: Initial and delta troponin undetectable. D-dimer WNL.  CBC without leukocytosis, mild stable anemia on comparison to prior, no  thrombocytopenia.  CMP without AKI, emergent electrolyte derangement, emergent LFT abnormality  Radiology: Ordered, Independent interpretation, Details: Chest x-ray without focal airspace opacification, cardiomediastinal silhouette derangement, pneumothorax, pleural effusion, bony derangement, and All images reviewed independently.  Agree with radiology report at this time.   DG Chest 2 View Result Date: 08/03/2024 CLINICAL DATA:  Chest pain EXAM: CHEST - 2 VIEW COMPARISON:  Chest x-ray performed March 23, 2020 FINDINGS: Elevation of the right hemidiaphragm. Low lung volumes. Heart mediastinum are not significantly changed. Mild central congestion. No pleural effusion or pneumothorax. IMPRESSION: 1. Low lung volumes with mild central pulmonary vascular congestion. Electronically Signed   By: Maude Naegeli M.D.   On: 08/03/2024 11:37    EKG/Medicine tests: Ordered and Independent interpretation EKG Interpretation Date/Time:  Wednesday August 03 2024 10:14:10 EDT Ventricular Rate:  82 PR Interval:  51 QRS Duration:  114 QT Interval:  373 QTC Calculation: 436 R Axis:  72  Text Interpretation: Sinus rhythm Short PR interval Borderline intraventricular conduction delay Artifact in lead(s) I II III aVR aVL aVF V3 V4 V5 V6 Confirmed by Rogelia Satterfield ([id number]) on 08/03/2024 2:02:07 PM  Interventions: Aspirin, lisinopril  See the EMR for full details regarding lab and imaging results.  Aspirin has been given.  The ECG reveals no anatomical ischemia representing STEMI, New-Onset Arrhythmia, or ischemic equivalent.  She has been risk stratified with a HEAR score of 3. Initial troponin is undetectable; delta troponin is undetectable.  The patient's presentation, the patient being hemodynamically stable, and the ECG are not consistent with Pericardial Tamponade. The patient's pain is not positional. This in conjunction with the lack of PR depressions and ST elevations on the ECG are reassuring  against Pericarditis. The patient's non-elevated troponin and ECG are also inconsistent with Myocarditis.  The CXR is unremarkable for focal airspace disease.  The patient is afebrile and denies productive cough.  Therefore, I do not suspect Pneumonia. There is no evidence of Pneumothorax on physical exam or on the CXR. CXR shows no evidence of Esophageal Tear and there is no recent intractable emesis or esophageal instrumentation. There is no peritonitis or free air on CXR worrisome for a Perforated Abdominal Viscous.  Pulmonary Embolism is on the differential. The patient is at  risk via the Revised Geneva Criteria. Therefore, we will further risk stratify the patient with a d-dimer.  This was WNL. Therefore, a CTA not indicated.  The patient's pain is not tearing and it does not radiate to back. Pulses are present bilaterally in both the upper and lower extremities. CXR does not show a widened mediastinum. I have a very low suspicion for Aortic Dissection.  Patient does have mild pulmonary vascular congestion on chest x-ray per radiology read, however no rales, no increased work of breathing, no hypoxia, therefore doubt significant pulmonary edema.  Do not feel that patient requires diuresis at this time.  Patient did have elevated blood pressure while in the ED, given additional dose of her home lisinopril, patient reports partial adherence to lisinopril at home, encouraged her to fully adhere to this medication, and to follow-up with PCP for any dose adjustments.  Given patient without ACS, aortic dissection, no current visual changes to raise suspicion for retinal hemorrhages, no severe headache or focal neurologic deficits to raise concern for ICH, no shortness of breath or hypoxia to raise concern for pulmonary edema do not feel that patient is having hypertensive emergency at this time, do not feel that patient requires strict blood pressure control at this time.  Patient expresses understanding for  plan to adhere to home medications and follow-up closely with PCP for further antihypertensive management.  Additionally will refer to cardiology.  Presentation is most consistent with acute complicated illness, Current presentation is complicated by underlying chronic conditions, and I did consider and rule out acute life/limb-threatening illness  Discussion of management or test interpretations with external provider(s): Not indicated  Risk Drugs:OTC drugs and Prescription drug management  Disposition: DISCHARGE: I believe that the patient is safe for discharge home with outpatient follow-up. Patient was informed of all pertinent physical exam, laboratory, and imaging findings. Patient's suspected etiology of their symptom presentation was discussed with the patient and all questions were answered. We discussed following up with PCP. I provided thorough ED return precautions. The patient feels safe and comfortable with this plan.  MDM generated using voice dictation software and may contain dictation errors.  Please contact me for  any clarification or with any questions.   Clinical Impression:  1. Chest pain, unspecified type      Discharge   Final Clinical Impression(s) / ED Diagnoses Final diagnoses:  Chest pain, unspecified type    Rx / DC Orders ED Discharge Orders          Ordered    Ambulatory referral to Cardiology       Comments: If you have not heard from the Cardiology office within the next 72 hours please call [phone].   08/03/24 1352             Rogelia Jerilynn RAMAN, MD 08/03/24 1407

## 2024-09-06 NOTE — Progress Notes (Deleted)
 Cardiology Office Note:    Date:  09/06/2024   ID:  Cynthia [name], DOB December 10, 1965, MRN [id number]  PCP:  Ilah Crigler, MD   [facility] Providers Cardiologist:  None { Click to update primary MD,subspecialty MD or APP then REFRESH:1}    Referring MD: Rogelia Jerilynn RAMAN, MD   No chief complaint on file. ***  History of Present Illness:    Cynthia [name] is a 58 y.o. female seen at the request of Dr Rogelia for evaluation of chest pain. She has a history of HTN, HLD, and prior CVA.  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    Past Surgical History:  Procedure Laterality Date   DILATATION & CURETTAGE/HYSTEROSCOPY WITH MYOSURE N/A 10/02/2021   Procedure: DILATATION & CURETTAGE/HYSTEROSCOPY WITH MYOSURE/ POLYPECTOMY/ MYOMECTOMY;  Surgeon: Marne Kelly Nest, MD;  Location: [facility];  Service: Gynecology;  Laterality: N/A;   TUBAL LIGATION      Current Medications: No outpatient medications have been marked as taking for the 09/15/24 encounter (Appointment) with Johncharles Fusselman M, MD.     Allergies:   Patient has no known allergies.   Social History   Socioeconomic History   Marital status: Divorced    Spouse name: Not on file   Number of children: Not on file   Years of education: Not on file   Highest education level: Not on file  Occupational History   Not on file  Tobacco Use   Smoking status: Never   Smokeless tobacco: Never  Substance and Sexual Activity   Alcohol use: No   Drug use: No   Sexual activity: Never    Birth control/protection: Surgical  Other Topics Concern   Not on file  Social History Narrative   Not on file   Social Drivers of Health   Financial  Resource Strain: Not on file  Food Insecurity: Not on file  Transportation Needs: Not on file  Physical Activity: Not on file  Stress: Not on file  Social Connections: Not on file     Family History: The patient's ***family history includes Cancer in her mother.  ROS:   Please see the history of present illness.    *** All other systems reviewed and are negative.  EKGs/Labs/Other Studies Reviewed:    The following studies were reviewed today: ***      Recent Labs: 08/03/2024: ALT 17; BUN 13; Creatinine, Ser 0.62; Hemoglobin 11.8; Platelets 284; Potassium 3.9; Sodium 142  Recent Lipid Panel No results found for: CHOL, TRIG, HDL, CHOLHDL, VLDL, LDLCALC, LDLDIRECT   Risk Assessment/Calculations:   {Does this patient have ATRIAL FIBRILLATION?[phone]}  No BP recorded.  {Refresh Note OR Click here to enter BP  :1}***         Physical Exam:    VS:  There were no vitals taken for this visit.    Wt Readings from Last 3 Encounters:  04/03/24 210 lb (95.3 kg)  09/16/23 258 lb 14.4 oz (117.4 kg)  10/02/21 272 lb 14.4 oz (123.8 kg)     GEN: *** Well nourished, well developed in no acute distress HEENT: Normal NECK: No JVD; No carotid bruits LYMPHATICS: No lymphadenopathy CARDIAC: ***RRR, no murmurs, rubs,  gallops RESPIRATORY:  Clear to auscultation without rales, wheezing or rhonchi  ABDOMEN: Soft, non-tender, non-distended MUSCULOSKELETAL:  No edema; No deformity  SKIN: Warm and dry NEUROLOGIC:  Alert and oriented x 3 PSYCHIATRIC:  Normal affect   ASSESSMENT:    No diagnosis found. PLAN:    In order of problems listed above:  ***      {Are you ordering a CV Procedure (e.g. stress test, cath, DCCV, TEE, etc)?   Press F2        :[id number]}    Medication Adjustments/Labs and Tests Ordered: Current medicines are reviewed at length with the patient today.  Concerns regarding medicines are outlined above.  No orders of the defined types were  placed in this encounter.  No orders of the defined types were placed in this encounter.   There are no Patient Instructions on file for this visit.   Signed, Rylei Masella, MD  09/06/2024 12:41 PM    [facility]

## 2024-09-15 ENCOUNTER — Ambulatory Visit: Attending: Internal Medicine | Admitting: Cardiology
